# Patient Record
Sex: Male | Born: 1950 | Race: Black or African American | Hispanic: No | Marital: Married
Health system: Southern US, Community
[De-identification: ages and names within clinical notes are randomized; demographics above are authoritative.]

## PROBLEM LIST (undated history)

## (undated) DIAGNOSIS — J9621 Acute and chronic respiratory failure with hypoxia: Secondary | ICD-10-CM

## (undated) DIAGNOSIS — J1282 Pneumonia due to coronavirus disease 2019: Secondary | ICD-10-CM

## (undated) DIAGNOSIS — I5022 Chronic systolic (congestive) heart failure: Secondary | ICD-10-CM

## (undated) DIAGNOSIS — U071 COVID-19: Secondary | ICD-10-CM

## (undated) DIAGNOSIS — Z930 Tracheostomy status: Secondary | ICD-10-CM

---

## 2019-11-14 ENCOUNTER — Inpatient Hospital Stay
Admission: AD | Admit: 2019-11-14 | Discharge: 2019-12-18 | Disposition: A | Discharge status: Skilled Nursing Facility | Payer: Medicare Other | Source: Other Acute Inpatient Hospital | Attending: Internal Medicine | Admitting: Internal Medicine

## 2019-11-14 ENCOUNTER — Other Ambulatory Visit (HOSPITAL_COMMUNITY): Payer: Medicare Other

## 2019-11-14 DIAGNOSIS — Z930 Tracheostomy status: Secondary | ICD-10-CM

## 2019-11-14 DIAGNOSIS — Z978 Presence of other specified devices: Secondary | ICD-10-CM

## 2019-11-14 DIAGNOSIS — J189 Pneumonia, unspecified organism: Secondary | ICD-10-CM

## 2019-11-14 DIAGNOSIS — I509 Heart failure, unspecified: Secondary | ICD-10-CM

## 2019-11-14 DIAGNOSIS — R0902 Hypoxemia: Secondary | ICD-10-CM

## 2019-11-14 DIAGNOSIS — R109 Unspecified abdominal pain: Secondary | ICD-10-CM

## 2019-11-14 DIAGNOSIS — J9621 Acute and chronic respiratory failure with hypoxia: Secondary | ICD-10-CM | POA: Diagnosis present

## 2019-11-14 DIAGNOSIS — I5022 Chronic systolic (congestive) heart failure: Secondary | ICD-10-CM | POA: Diagnosis present

## 2019-11-14 DIAGNOSIS — J1282 Pneumonia due to coronavirus disease 2019: Secondary | ICD-10-CM | POA: Diagnosis present

## 2019-11-14 DIAGNOSIS — Z4659 Encounter for fitting and adjustment of other gastrointestinal appliance and device: Secondary | ICD-10-CM

## 2019-11-14 DIAGNOSIS — U071 COVID-19: Secondary | ICD-10-CM | POA: Diagnosis present

## 2019-11-14 DIAGNOSIS — J9690 Respiratory failure, unspecified, unspecified whether with hypoxia or hypercapnia: Secondary | ICD-10-CM

## 2019-11-14 HISTORY — DX: Chronic systolic (congestive) heart failure: I50.22

## 2019-11-14 HISTORY — DX: COVID-19: U07.1

## 2019-11-14 HISTORY — DX: Tracheostomy status: Z93.0

## 2019-11-14 HISTORY — DX: Acute and chronic respiratory failure with hypoxia: J96.21

## 2019-11-14 HISTORY — DX: Pneumonia due to coronavirus disease 2019: J12.82

## 2019-11-14 LAB — BLOOD GAS, ARTERIAL
Acid-Base Excess: 11.9 mmol/L — ABNORMAL HIGH (ref 0.0–2.0)
Bicarbonate: 38.2 mmol/L — ABNORMAL HIGH (ref 20.0–28.0)
Drawn by: 354196
FIO2: 60
O2 Saturation: 93.2 %
Patient temperature: 37
pCO2 arterial: 76.5 mmHg (ref 32.0–48.0)
pH, Arterial: 7.319 — ABNORMAL LOW (ref 7.350–7.450)
pO2, Arterial: 75.1 mmHg — ABNORMAL LOW (ref 83.0–108.0)

## 2019-11-15 LAB — PROTIME-INR
INR: 1.4 — ABNORMAL HIGH (ref 0.8–1.2)
Prothrombin Time: 16.6 seconds — ABNORMAL HIGH (ref 11.4–15.2)

## 2019-11-15 LAB — COMPREHENSIVE METABOLIC PANEL
ALT: 18 U/L (ref 0–44)
AST: 24 U/L (ref 15–41)
Albumin: 1.6 g/dL — ABNORMAL LOW (ref 3.5–5.0)
Alkaline Phosphatase: 59 U/L (ref 38–126)
Anion gap: 9 (ref 5–15)
BUN: 18 mg/dL (ref 8–23)
CO2: 35 mmol/L — ABNORMAL HIGH (ref 22–32)
Calcium: 9.4 mg/dL (ref 8.9–10.3)
Chloride: 99 mmol/L (ref 98–111)
Creatinine, Ser: 0.76 mg/dL (ref 0.61–1.24)
GFR calc non Af Amer: >60 mL/min (ref >60)
Glucose, Bld: 100 mg/dL — ABNORMAL HIGH (ref 70–99)
Potassium: 3.8 mmol/L (ref 3.5–5.1)
Sodium: 143 mmol/L (ref 135–145)
Total Bilirubin: 0.4 mg/dL (ref 0.3–1.2)
Total Protein: 6.5 g/dL (ref 6.5–8.1)

## 2019-11-15 LAB — BLOOD GAS, ARTERIAL
Acid-Base Excess: 12.8 mmol/L — ABNORMAL HIGH (ref 0.0–2.0)
Bicarbonate: 37.8 mmol/L — ABNORMAL HIGH (ref 20.0–28.0)
FIO2: 60
O2 Saturation: 95.1 %
Patient temperature: 36.7
pCO2 arterial: 57.8 mmHg — ABNORMAL HIGH (ref 32.0–48.0)
pH, Arterial: 7.43 (ref 7.350–7.450)
pO2, Arterial: 72 mmHg — ABNORMAL LOW (ref 83.0–108.0)

## 2019-11-15 LAB — URINALYSIS, ROUTINE W REFLEX MICROSCOPIC
Bilirubin Urine: NEGATIVE
Glucose, UA: NEGATIVE mg/dL
Hgb urine dipstick: NEGATIVE
Ketones, ur: NEGATIVE mg/dL
Leukocytes,Ua: NEGATIVE
Nitrite: NEGATIVE
Protein, ur: 30 mg/dL — AB
Specific Gravity, Urine: 1.021 (ref 1.005–1.030)
pH: 5 (ref 5.0–8.0)

## 2019-11-15 LAB — CBC
HCT: 31.8 % — ABNORMAL LOW (ref 39.0–52.0)
Hemoglobin: 9.3 g/dL — ABNORMAL LOW (ref 13.0–17.0)
MCH: 25 pg — ABNORMAL LOW (ref 26.0–34.0)
MCHC: 29.2 g/dL — ABNORMAL LOW (ref 30.0–36.0)
MCV: 85.5 fL (ref 80.0–100.0)
Platelets: 461 10*3/uL — ABNORMAL HIGH (ref 150–400)
RBC: 3.72 MIL/uL — ABNORMAL LOW (ref 4.22–5.81)
RDW: 17.4 % — ABNORMAL HIGH (ref 11.5–15.5)
WBC: 8.1 10*3/uL (ref 4.0–10.5)
nRBC: 0 % (ref 0.0–0.2)

## 2019-11-15 LAB — TSH: TSH: 2.859 u[IU]/mL (ref 0.350–4.500)

## 2019-11-16 ENCOUNTER — Encounter: Payer: Self-pay | Admitting: Internal Medicine

## 2019-11-16 DIAGNOSIS — J9621 Acute and chronic respiratory failure with hypoxia: Secondary | ICD-10-CM

## 2019-11-16 DIAGNOSIS — I5022 Chronic systolic (congestive) heart failure: Secondary | ICD-10-CM

## 2019-11-16 DIAGNOSIS — J1282 Pneumonia due to coronavirus disease 2019: Secondary | ICD-10-CM

## 2019-11-16 DIAGNOSIS — Z930 Tracheostomy status: Secondary | ICD-10-CM

## 2019-11-16 DIAGNOSIS — U071 COVID-19: Secondary | ICD-10-CM

## 2019-11-16 NOTE — Consult Note (Signed)
Pulmonary Critical Care Medicine [facility] [place]  PULMONARY SERVICE  Date of Service: 11/16/2019  PULMONARY CRITICAL CARE Jacob Vasquez  [id number]  DOB: 18-Sep-1950   DOA: 11/14/2019  Referring Physician: Carron Curie, MD  HPI: Jacob Vasquez is a 69 y.o. male seen for follow up of Acute on Chronic Respiratory Failure.  Patient presents for further management and weaning.  Patient initially presented to the other facility with complaints of increasing swelling of the legs.  Was found at that time to have cardiogenic pulmonary edema initially started on BiPAP therapy subsequently developed shock with drop in blood pressure transferred to the ICU started on vasopressors.  The patient was treated aggressively for cardiac failure did have some improvement subsequently was found to have Klebsiella pneumonia urinary tract infection.  The patient was hypoxemic secondary to COVID-19 infection.  Patient subsequently was not able to come off the ventilator had to have a tracheostomy.  Now is transferred to our facility for further management.  Review of Systems:  ROS performed and is unremarkable other than noted above.  Past medical history: CSF leak COVID-19 Heart failure   Past surgical history: Tracheostomy  Social history: Unknown tobacco alcohol or drug abuse  Family history: Noncontributory to the present illness  Medications: Reviewed on Rounds  Physical Exam:  Vitals: Temperature is 97.1 pulse 68 respiratory rate 20 blood pressure is 142/92 saturations 100%  Ventilator Settings pressure assist control FiO2 55% IP 15 PEEP 10 tidal volume 515  . General: Comfortable at this time . Eyes: Grossly normal lids, irises & conjunctiva . ENT: grossly tongue is normal . Neck: no obvious mass . Cardiovascular: S1-S2 normal no gallop or rub . Respiratory: Scattered rhonchi expansion is equal . Abdomen: Soft and nontender . Skin: no rash seen on limited  exam . Musculoskeletal: not rigid . Psychiatric:unable to assess . Neurologic: no seizure no involuntary movements         Labs on Admission:  Basic Metabolic Panel: Recent Labs  Lab 11/15/19 1022  NA 143  K 3.8  CL 99  CO2 35*  GLUCOSE 100*  BUN 18  CREATININE 0.76  CALCIUM 9.4    Recent Labs  Lab 11/14/19 2230 11/15/19 0810  PHART 7.319* 7.430  PCO2ART 76.5* 57.8*  PO2ART 75.1* 72.0*  HCO3 38.2* 37.8*  O2SAT 93.2 95.1    Liver Function Tests: Recent Labs  Lab 11/15/19 1022  AST 24  ALT 18  ALKPHOS 59  BILITOT 0.4  PROT 6.5  ALBUMIN 1.6*   No results for input(s): LIPASE, AMYLASE in the last 168 hours. No results for input(s): AMMONIA in the last 168 hours.  CBC: Recent Labs  Lab 11/15/19 1022  WBC 8.1  HGB 9.3*  HCT 31.8*  MCV 85.5  PLT 461*    Cardiac Enzymes: No results for input(s): CKTOTAL, CKMB, CKMBINDEX, TROPONINI in the last 168 hours.  BNP (last 3 results) No results for input(s): BNP in the last 8760 hours.  ProBNP (last 3 results) No results for input(s): PROBNP in the last 8760 hours.   Radiological Exams on Admission: DG Abd 1 View  Result Date: 11/14/2019 CLINICAL DATA:  69 year old male with respiratory failure. NG placement. EXAM: PORTABLE CHEST 1 VIEW COMPARISON:  None. FINDINGS: Endotracheal tube above the carina and left sided PICC with tip over upper SVC. An enteric tube extends below the diaphragm with tip in the distal stomach. There is diffuse bilateral interstitial and hazy densities primarily involving the mid to  lower lung field which may represent edema or atypical pneumonia. Clinical correlation is recommended. No lobar consolidation, pleural effusion, or pneumothorax. There is mild cardiomegaly. Air is noted within the colon. There is degenerative changes of the spine. IMPRESSION: 1. Enteric tube with tip in the distal stomach. 2. Diffuse bilateral interstitial and hazy densities may represent edema or atypical  pneumonia. Electronically Signed   By: Elgie Collard M.D.   On: 11/14/2019 17:28   DG CHEST PORT 1 VIEW  Result Date: 11/14/2019 CLINICAL DATA:  69 year old male with respiratory failure. NG placement. EXAM: PORTABLE CHEST 1 VIEW COMPARISON:  None. FINDINGS: Endotracheal tube above the carina and left sided PICC with tip over upper SVC. An enteric tube extends below the diaphragm with tip in the distal stomach. There is diffuse bilateral interstitial and hazy densities primarily involving the mid to lower lung field which may represent edema or atypical pneumonia. Clinical correlation is recommended. No lobar consolidation, pleural effusion, or pneumothorax. There is mild cardiomegaly. Air is noted within the colon. There is degenerative changes of the spine. IMPRESSION: 1. Enteric tube with tip in the distal stomach. 2. Diffuse bilateral interstitial and hazy densities may represent edema or atypical pneumonia. Electronically Signed   By: Elgie Collard M.D.   On: 11/14/2019 17:28    Assessment/Plan Active Problems:   Acute on chronic respiratory failure with hypoxia (HCC)   COVID-19 virus infection   Pneumonia due to COVID-19 virus   Chronic HFrEF (heart failure with reduced ejection fraction) (HCC)   Tracheostomy status (HCC)   1. Acute on chronic respiratory failure hypoxia at this time patient is on full support respiratory therapy will assess the RSB I mechanics and try to wean today. 2. COVID-19 virus infection in recovery phase there is diffuse interstitial changes noted on the chest film we will continue to follow. 3. COVID-19 pneumonia has been treated in recovery phase 4. Chronic heart failure reduced ejection fraction patient has not had a recent echocardiogram would consider getting an echocardiogram. 5. Tracheostomy status right now remains in place we will continue to monitor.  I have personally seen and evaluated the patient, evaluated laboratory and imaging results,  formulated the assessment and plan and placed orders. The Patient requires high complexity decision making with multiple systems involvement.  Case was discussed on Rounds with the Respiratory Therapy Director and the Respiratory staff Time Spent  Yevonne Pax, MD [facility] Pulmonary Critical Care Medicine Sleep Medicine

## 2019-11-17 DIAGNOSIS — I5022 Chronic systolic (congestive) heart failure: Secondary | ICD-10-CM | POA: Diagnosis not present

## 2019-11-17 DIAGNOSIS — Z930 Tracheostomy status: Secondary | ICD-10-CM | POA: Diagnosis not present

## 2019-11-17 DIAGNOSIS — U071 COVID-19: Secondary | ICD-10-CM | POA: Diagnosis not present

## 2019-11-17 DIAGNOSIS — J9621 Acute and chronic respiratory failure with hypoxia: Secondary | ICD-10-CM | POA: Diagnosis not present

## 2019-11-17 NOTE — Progress Notes (Signed)
Pulmonary Critical Care Medicine [facility] [place]   PULMONARY CRITICAL CARE SERVICE  PROGRESS NOTE  Date of Service: 11/17/2019  Jacob Vasquez  [id number]  DOB: Jul 08, 1950   DOA: 11/14/2019  Referring Physician: Carron Curie, MD  HPI: Jacob Vasquez is a 69 y.o. male seen for follow up of Acute on Chronic Respiratory Failure.  Patient currently is on pressure support mode has been on 45% FiO2 today's goal is up to 6 hours  Medications: Reviewed on Rounds  Physical Exam:  Vitals: Temperature 96.4 pulse 61 respiratory rate 25 blood pressures 180/95 saturations 100%  Ventilator Settings pressure support FiO2 45% pressure 12/7  . General: Comfortable at this time . Eyes: Grossly normal lids, irises & conjunctiva . ENT: grossly tongue is normal . Neck: no obvious mass . Cardiovascular: S1 S2 normal no gallop . Respiratory: Scattered rhonchi expansion is equal . Abdomen: soft . Skin: no rash seen on limited exam . Musculoskeletal: not rigid . Psychiatric:unable to assess . Neurologic: no seizure no involuntary movements         Lab Data:   Basic Metabolic Panel: Recent Labs  Lab 11/15/19 1022  NA 143  K 3.8  CL 99  CO2 35*  GLUCOSE 100*  BUN 18  CREATININE 0.76  CALCIUM 9.4    ABG: Recent Labs  Lab 11/14/19 2230 11/15/19 0810  PHART 7.319* 7.430  PCO2ART 76.5* 57.8*  PO2ART 75.1* 72.0*  HCO3 38.2* 37.8*  O2SAT 93.2 95.1    Liver Function Tests: Recent Labs  Lab 11/15/19 1022  AST 24  ALT 18  ALKPHOS 59  BILITOT 0.4  PROT 6.5  ALBUMIN 1.6*   No results for input(s): LIPASE, AMYLASE in the last 168 hours. No results for input(s): AMMONIA in the last 168 hours.  CBC: Recent Labs  Lab 11/15/19 1022  WBC 8.1  HGB 9.3*  HCT 31.8*  MCV 85.5  PLT 461*    Cardiac Enzymes: No results for input(s): CKTOTAL, CKMB, CKMBINDEX, TROPONINI in the last 168 hours.  BNP (last 3 results) No results for input(s): BNP in the last 8760  hours.  ProBNP (last 3 results) No results for input(s): PROBNP in the last 8760 hours.  Radiological Exams: No results found.  Assessment/Plan Active Problems:   Acute on chronic respiratory failure with hypoxia (HCC)   COVID-19 virus infection   Pneumonia due to COVID-19 virus   Chronic HFrEF (heart failure with reduced ejection fraction) (HCC)   Tracheostomy status (HCC)   1. Acute on chronic respiratory failure hypoxia we will continue to wean on pressure support 12/7 goal is up to 6 hours 2. COVID-19 virus infection in resolution we will continue to follow 3. Pneumonia due to COVID-19 has been treated we will continue with supportive care 4. Chronic heart failure reduced ejection fraction supportive care 5. Tracheostomy remains in place   I have personally seen and evaluated the patient, evaluated laboratory and imaging results, formulated the assessment and plan and placed orders. The Patient requires high complexity decision making with multiple systems involvement.  Rounds were done with the Respiratory Therapy Director and Staff therapists and discussed with nursing staff also.  Yevonne Pax, MD [facility] Pulmonary Critical Care Medicine Sleep Medicine

## 2019-11-18 DIAGNOSIS — Z930 Tracheostomy status: Secondary | ICD-10-CM | POA: Diagnosis not present

## 2019-11-18 DIAGNOSIS — U071 COVID-19: Secondary | ICD-10-CM | POA: Diagnosis not present

## 2019-11-18 DIAGNOSIS — I5022 Chronic systolic (congestive) heart failure: Secondary | ICD-10-CM | POA: Diagnosis not present

## 2019-11-18 DIAGNOSIS — J9621 Acute and chronic respiratory failure with hypoxia: Secondary | ICD-10-CM | POA: Diagnosis not present

## 2019-11-18 NOTE — Progress Notes (Signed)
Pulmonary Critical Care Medicine [facility] [place]   PULMONARY CRITICAL CARE SERVICE  PROGRESS NOTE  Date of Service: 11/18/2019  Jacob Vasquez  [id number]  DOB: 08-14-1950   DOA: 11/14/2019  Referring Physician: Carron Curie, MD  HPI: Jacob Vasquez is a 69 y.o. male seen for follow up of Acute on Chronic Respiratory Failure.  Patient currently is on pressure support has been on 45% FiO2 and is on 12/7 for a goal of 12 hours  Medications: Reviewed on Rounds  Physical Exam:  Vitals: Temperature is 96.2 pulse 65 respiratory rate 25 blood pressure is 173/73 saturations 98%  Ventilator Settings on pressure support FiO2 is 45% pressure 12/7  . General: Comfortable at this time . Eyes: Grossly normal lids, irises & conjunctiva . ENT: grossly tongue is normal . Neck: no obvious mass . Cardiovascular: S1 S2 normal no gallop . Respiratory: No rhonchi very coarse breath sounds . Abdomen: soft . Skin: no rash seen on limited exam . Musculoskeletal: not rigid . Psychiatric:unable to assess . Neurologic: no seizure no involuntary movements         Lab Data:   Basic Metabolic Panel: Recent Labs  Lab 11/15/19 1022  NA 143  K 3.8  CL 99  CO2 35*  GLUCOSE 100*  BUN 18  CREATININE 0.76  CALCIUM 9.4    ABG: Recent Labs  Lab 11/14/19 2230 11/15/19 0810  PHART 7.319* 7.430  PCO2ART 76.5* 57.8*  PO2ART 75.1* 72.0*  HCO3 38.2* 37.8*  O2SAT 93.2 95.1    Liver Function Tests: Recent Labs  Lab 11/15/19 1022  AST 24  ALT 18  ALKPHOS 59  BILITOT 0.4  PROT 6.5  ALBUMIN 1.6*   No results for input(s): LIPASE, AMYLASE in the last 168 hours. No results for input(s): AMMONIA in the last 168 hours.  CBC: Recent Labs  Lab 11/15/19 1022  WBC 8.1  HGB 9.3*  HCT 31.8*  MCV 85.5  PLT 461*    Cardiac Enzymes: No results for input(s): CKTOTAL, CKMB, CKMBINDEX, TROPONINI in the last 168 hours.  BNP (last 3 results) No results for input(s): BNP in the  last 8760 hours.  ProBNP (last 3 results) No results for input(s): PROBNP in the last 8760 hours.  Radiological Exams: No results found.  Assessment/Plan Active Problems:   Acute on chronic respiratory failure with hypoxia (HCC)   COVID-19 virus infection   Pneumonia due to COVID-19 virus   Chronic HFrEF (heart failure with reduced ejection fraction) (HCC)   Tracheostomy status (HCC)   1. Acute on chronic respiratory failure hypoxia we will continue to wean as tolerated on pressure support hopefully complete 12 hours today. 2. COVID-19 virus infection in recovery we will continue to monitor 3. Pneumonia due to COVID-19 treated 4. Chronic heart failure reduced ejection fraction monitor EF closely 5. Tracheostomy remains in place   I have personally seen and evaluated the patient, evaluated laboratory and imaging results, formulated the assessment and plan and placed orders. The Patient requires high complexity decision making with multiple systems involvement.  Rounds were done with the Respiratory Therapy Director and Staff therapists and discussed with nursing staff also.  Yevonne Pax, MD [facility] Pulmonary Critical Care Medicine Sleep Medicine

## 2019-11-19 DIAGNOSIS — I5022 Chronic systolic (congestive) heart failure: Secondary | ICD-10-CM | POA: Diagnosis not present

## 2019-11-19 DIAGNOSIS — U071 COVID-19: Secondary | ICD-10-CM | POA: Diagnosis not present

## 2019-11-19 DIAGNOSIS — J9621 Acute and chronic respiratory failure with hypoxia: Secondary | ICD-10-CM | POA: Diagnosis not present

## 2019-11-19 DIAGNOSIS — Z930 Tracheostomy status: Secondary | ICD-10-CM | POA: Diagnosis not present

## 2019-11-19 NOTE — Progress Notes (Addendum)
Pulmonary Critical Care Medicine [facility] [place]   PULMONARY CRITICAL CARE SERVICE  PROGRESS NOTE  Date of Service: 11/19/2019  Jacob Vasquez  [id number]  DOB: 1950/12/14   DOA: 11/14/2019  Referring Physician: Carron Curie, MD  HPI: Jacob Vasquez is a 69 y.o. male seen for follow up of Acute on Chronic Respiratory Failure.  Patient continues to wean on aerosol trach collar currently on 45% FiO2 has a 12-hour goal today satting well no distress.  Medications: Reviewed on Rounds  Physical Exam:  Vitals: Pulse 59 respirations 20 BP 179/93 O2 sat 97% temp 96.9  Ventilator Settings not currently on ventilator  . General: Comfortable at this time . Eyes: Grossly normal lids, irises & conjunctiva . ENT: grossly tongue is normal . Neck: no obvious mass . Cardiovascular: S1 S2 normal no gallop . Respiratory: Coarse breath sounds . Abdomen: soft . Skin: no rash seen on limited exam . Musculoskeletal: not rigid . Psychiatric:unable to assess . Neurologic: no seizure no involuntary movements         Lab Data:   Basic Metabolic Panel: Recent Labs  Lab 11/15/19 1022  NA 143  K 3.8  CL 99  CO2 35*  GLUCOSE 100*  BUN 18  CREATININE 0.76  CALCIUM 9.4    ABG: Recent Labs  Lab 11/14/19 2230 11/15/19 0810  PHART 7.319* 7.430  PCO2ART 76.5* 57.8*  PO2ART 75.1* 72.0*  HCO3 38.2* 37.8*  O2SAT 93.2 95.1    Liver Function Tests: Recent Labs  Lab 11/15/19 1022  AST 24  ALT 18  ALKPHOS 59  BILITOT 0.4  PROT 6.5  ALBUMIN 1.6*   No results for input(s): LIPASE, AMYLASE in the last 168 hours. No results for input(s): AMMONIA in the last 168 hours.  CBC: Recent Labs  Lab 11/15/19 1022  WBC 8.1  HGB 9.3*  HCT 31.8*  MCV 85.5  PLT 461*    Cardiac Enzymes: No results for input(s): CKTOTAL, CKMB, CKMBINDEX, TROPONINI in the last 168 hours.  BNP (last 3 results) No results for input(s): BNP in the last 8760 hours.  ProBNP (last 3  results) No results for input(s): PROBNP in the last 8760 hours.  Radiological Exams: No results found.  Assessment/Plan Active Problems:   Acute on chronic respiratory failure with hypoxia (HCC)   COVID-19 virus infection   Pneumonia due to COVID-19 virus   Chronic HFrEF (heart failure with reduced ejection fraction) (HCC)   Tracheostomy status (HCC)   1. Acute on chronic respiratory failure hypoxia patient will continue to wean on aerosol trach collar currently 45% FiO2 for 12-hour goal today we will continue supportive measures and pulmonary toilet. 2. COVID-19 virus infection in recovery we will continue to monitor 3. Pneumonia due to COVID-19 treated 4. Chronic heart failure reduced ejection fraction monitor EF closely 5. Tracheostomy remains in place   I have personally seen and evaluated the patient, evaluated laboratory and imaging results, formulated the assessment and plan and placed orders. The Patient requires high complexity decision making with multiple systems involvement.  Rounds were done with the Respiratory Therapy Director and Staff therapists and discussed with nursing staff also.  Yevonne Pax, MD [facility] Pulmonary Critical Care Medicine Sleep Medicine

## 2019-11-20 DIAGNOSIS — Z930 Tracheostomy status: Secondary | ICD-10-CM | POA: Diagnosis not present

## 2019-11-20 DIAGNOSIS — J9621 Acute and chronic respiratory failure with hypoxia: Secondary | ICD-10-CM | POA: Diagnosis not present

## 2019-11-20 DIAGNOSIS — I5022 Chronic systolic (congestive) heart failure: Secondary | ICD-10-CM | POA: Diagnosis not present

## 2019-11-20 DIAGNOSIS — U071 COVID-19: Secondary | ICD-10-CM | POA: Diagnosis not present

## 2019-11-20 LAB — CBC
HCT: 36.4 % — ABNORMAL LOW (ref 39.0–52.0)
Hemoglobin: 11.1 g/dL — ABNORMAL LOW (ref 13.0–17.0)
MCH: 25.8 pg — ABNORMAL LOW (ref 26.0–34.0)
MCHC: 30.5 g/dL (ref 30.0–36.0)
MCV: 84.5 fL (ref 80.0–100.0)
Platelets: 429 10*3/uL — ABNORMAL HIGH (ref 150–400)
RBC: 4.31 MIL/uL (ref 4.22–5.81)
RDW: 18.9 % — ABNORMAL HIGH (ref 11.5–15.5)
WBC: 16.4 10*3/uL — ABNORMAL HIGH (ref 4.0–10.5)
nRBC: 0 % (ref 0.0–0.2)

## 2019-11-20 LAB — BASIC METABOLIC PANEL
Anion gap: 10 (ref 5–15)
BUN: 52 mg/dL — ABNORMAL HIGH (ref 8–23)
CO2: 36 mmol/L — ABNORMAL HIGH (ref 22–32)
Calcium: 10.2 mg/dL (ref 8.9–10.3)
Chloride: 100 mmol/L (ref 98–111)
Creatinine, Ser: 0.89 mg/dL (ref 0.61–1.24)
GFR, Estimated: >60 mL/min (ref >60)
Glucose, Bld: 197 mg/dL — ABNORMAL HIGH (ref 70–99)
Potassium: 3.9 mmol/L (ref 3.5–5.1)
Sodium: 146 mmol/L — ABNORMAL HIGH (ref 135–145)

## 2019-11-20 NOTE — Progress Notes (Addendum)
Pulmonary Critical Care Medicine [facility] [place]   PULMONARY CRITICAL CARE SERVICE  PROGRESS NOTE  Date of Service: 11/20/2019  Jacob Vasquez  [id number]  DOB: 07-10-1950   DOA: 11/14/2019  Referring Physician: Carron Curie, MD  HPI: Jacob Vasquez is a 69 y.o. male seen for follow up of Acute on Chronic Respiratory Failure.  Patient continues to wean on T-bar for 16-hour goal at this time satting well no distress.  Medications: Reviewed on Rounds  Physical Exam:  Vitals: Pulse 64 respirations 22 BP 110/88 O2 sat 95% temp 98.2  Ventilator Settings not currently on ventilator  . General: Comfortable at this time . Eyes: Grossly normal lids, irises & conjunctiva . ENT: grossly tongue is normal . Neck: no obvious mass . Cardiovascular: S1 S2 normal no gallop . Respiratory: No rales or rhonchi noted . Abdomen: soft . Skin: no rash seen on limited exam . Musculoskeletal: not rigid . Psychiatric:unable to assess . Neurologic: no seizure no involuntary movements         Lab Data:   Basic Metabolic Panel: Recent Labs  Lab 11/15/19 1022 11/20/19 0546  NA 143 146*  K 3.8 3.9  CL 99 100  CO2 35* 36*  GLUCOSE 100* 197*  BUN 18 52*  CREATININE 0.76 0.89  CALCIUM 9.4 10.2    ABG: Recent Labs  Lab 11/14/19 2230 11/15/19 0810  PHART 7.319* 7.430  PCO2ART 76.5* 57.8*  PO2ART 75.1* 72.0*  HCO3 38.2* 37.8*  O2SAT 93.2 95.1    Liver Function Tests: Recent Labs  Lab 11/15/19 1022  AST 24  ALT 18  ALKPHOS 59  BILITOT 0.4  PROT 6.5  ALBUMIN 1.6*   No results for input(s): LIPASE, AMYLASE in the last 168 hours. No results for input(s): AMMONIA in the last 168 hours.  CBC: Recent Labs  Lab 11/15/19 1022 11/20/19 0546  WBC 8.1 16.4*  HGB 9.3* 11.1*  HCT 31.8* 36.4*  MCV 85.5 84.5  PLT 461* 429*    Cardiac Enzymes: No results for input(s): CKTOTAL, CKMB, CKMBINDEX, TROPONINI in the last 168 hours.  BNP (last 3 results) No results for  input(s): BNP in the last 8760 hours.  ProBNP (last 3 results) No results for input(s): PROBNP in the last 8760 hours.  Radiological Exams: No results found.  Assessment/Plan Active Problems:   Acute on chronic respiratory failure with hypoxia (HCC)   COVID-19 virus infection   Pneumonia due to COVID-19 virus   Chronic HFrEF (heart failure with reduced ejection fraction) (HCC)   Tracheostomy status (HCC)   1. Acute on chronic respiratory failure hypoxia  patient will continue to wean for 16-hour goal today on 35% T-bar will continue aggressive pulmonary toilet supportive measures. 2. COVID-19 virus infection in recovery we will continue to monitor 3. Pneumonia due to COVID-19 treated 4. Chronic heart failure reduced ejection fraction monitor EF closely 5. Tracheostomy remains in place   I have personally seen and evaluated the patient, evaluated laboratory and imaging results, formulated the assessment and plan and placed orders. The Patient requires high complexity decision making with multiple systems involvement.  Rounds were done with the Respiratory Therapy Director and Staff therapists and discussed with nursing staff also.  Yevonne Pax, MD [facility] Pulmonary Critical Care Medicine Sleep Medicine

## 2019-11-21 DIAGNOSIS — I5022 Chronic systolic (congestive) heart failure: Secondary | ICD-10-CM | POA: Diagnosis not present

## 2019-11-21 DIAGNOSIS — J9621 Acute and chronic respiratory failure with hypoxia: Secondary | ICD-10-CM | POA: Diagnosis not present

## 2019-11-21 DIAGNOSIS — U071 COVID-19: Secondary | ICD-10-CM | POA: Diagnosis not present

## 2019-11-21 DIAGNOSIS — Z930 Tracheostomy status: Secondary | ICD-10-CM | POA: Diagnosis not present

## 2019-11-21 LAB — BLOOD GAS, ARTERIAL
Acid-Base Excess: 12.3 mmol/L — ABNORMAL HIGH (ref 0.0–2.0)
Bicarbonate: 36.4 mmol/L — ABNORMAL HIGH (ref 20.0–28.0)
FIO2: 35
O2 Saturation: 88.9 %
Patient temperature: 36.3
pCO2 arterial: 45.5 mmHg (ref 32.0–48.0)
pH, Arterial: 7.511 — ABNORMAL HIGH (ref 7.350–7.450)
pO2, Arterial: 53.7 mmHg — ABNORMAL LOW (ref 83.0–108.0)

## 2019-11-21 NOTE — Progress Notes (Addendum)
Pulmonary Critical Care Medicine [facility] [place]   PULMONARY CRITICAL CARE SERVICE  PROGRESS NOTE  Date of Service: 11/21/2019  Jacob Vasquez  [id number]  DOB: 05-22-1950   DOA: 11/14/2019  Referring Physician: Carron Curie, MD  HPI: Jacob Vasquez is a 69 y.o. male seen for follow up of Acute on Chronic Respiratory Failure.  Patient remains on aerosol trach collar at this time.  No distress noted.  Medications: Reviewed on Rounds  Physical Exam:  Vitals: Pulse 67 respirations 26 BP 178/94 O2 sat 93% temp 96.7  Ventilator Settings not currently on ventilator  . General: Comfortable at this time . Eyes: Grossly normal lids, irises & conjunctiva . ENT: grossly tongue is normal . Neck: no obvious mass . Cardiovascular: S1 S2 normal no gallop . Respiratory: Coarse breath sounds . Abdomen: soft . Skin: no rash seen on limited exam . Musculoskeletal: not rigid . Psychiatric:unable to assess . Neurologic: no seizure no involuntary movements         Lab Data:   Basic Metabolic Panel: Recent Labs  Lab 11/15/19 1022 11/20/19 0546  NA 143 146*  K 3.8 3.9  CL 99 100  CO2 35* 36*  GLUCOSE 100* 197*  BUN 18 52*  CREATININE 0.76 0.89  CALCIUM 9.4 10.2    ABG: Recent Labs  Lab 11/14/19 2230 11/15/19 0810  PHART 7.319* 7.430  PCO2ART 76.5* 57.8*  PO2ART 75.1* 72.0*  HCO3 38.2* 37.8*  O2SAT 93.2 95.1    Liver Function Tests: Recent Labs  Lab 11/15/19 1022  AST 24  ALT 18  ALKPHOS 59  BILITOT 0.4  PROT 6.5  ALBUMIN 1.6*   No results for input(s): LIPASE, AMYLASE in the last 168 hours. No results for input(s): AMMONIA in the last 168 hours.  CBC: Recent Labs  Lab 11/15/19 1022 11/20/19 0546  WBC 8.1 16.4*  HGB 9.3* 11.1*  HCT 31.8* 36.4*  MCV 85.5 84.5  PLT 461* 429*    Cardiac Enzymes: No results for input(s): CKTOTAL, CKMB, CKMBINDEX, TROPONINI in the last 168 hours.  BNP (last 3 results) No results for input(s): BNP in the  last 8760 hours.  ProBNP (last 3 results) No results for input(s): PROBNP in the last 8760 hours.  Radiological Exams: No results found.  Assessment/Plan Active Problems:   Acute on chronic respiratory failure with hypoxia (HCC)   COVID-19 virus infection   Pneumonia due to COVID-19 virus   Chronic HFrEF (heart failure with reduced ejection fraction) (HCC)   Tracheostomy status (HCC)   1. Acute on chronic respiratory failure hypoxiapatient will continue to wean on aerosol trach collar 35% FiO2 has completed 24 hours at this time.  We will continue aggressive pulmonary toilet supportive measures. 2. COVID-19 virus infection in recovery we will continue to monitor 3. Pneumonia due to COVID-19 treated 4. Chronic heart failure reduced ejection fraction monitor EF closely 5. Tracheostomy remains in place   I have personally seen and evaluated the patient, evaluated laboratory and imaging results, formulated the assessment and plan and placed orders. The Patient requires high complexity decision making with multiple systems involvement.  Rounds were done with the Respiratory Therapy Director and Staff therapists and discussed with nursing staff also.  Yevonne Pax, MD [facility] Pulmonary Critical Care Medicine Sleep Medicine

## 2019-11-22 DIAGNOSIS — I5022 Chronic systolic (congestive) heart failure: Secondary | ICD-10-CM | POA: Diagnosis not present

## 2019-11-22 DIAGNOSIS — U071 COVID-19: Secondary | ICD-10-CM | POA: Diagnosis not present

## 2019-11-22 DIAGNOSIS — J9621 Acute and chronic respiratory failure with hypoxia: Secondary | ICD-10-CM | POA: Diagnosis not present

## 2019-11-22 DIAGNOSIS — Z930 Tracheostomy status: Secondary | ICD-10-CM | POA: Diagnosis not present

## 2019-11-22 LAB — BASIC METABOLIC PANEL
Anion gap: 10 (ref 5–15)
BUN: 46 mg/dL — ABNORMAL HIGH (ref 8–23)
CO2: 32 mmol/L (ref 22–32)
Calcium: 9.3 mg/dL (ref 8.9–10.3)
Chloride: 104 mmol/L (ref 98–111)
Creatinine, Ser: 0.82 mg/dL (ref 0.61–1.24)
GFR, Estimated: >60 mL/min (ref >60)
Glucose, Bld: 249 mg/dL — ABNORMAL HIGH (ref 70–99)
Potassium: 4.2 mmol/L (ref 3.5–5.1)
Sodium: 146 mmol/L — ABNORMAL HIGH (ref 135–145)

## 2019-11-22 LAB — CBC
HCT: 34.3 % — ABNORMAL LOW (ref 39.0–52.0)
Hemoglobin: 10.9 g/dL — ABNORMAL LOW (ref 13.0–17.0)
MCH: 26 pg (ref 26.0–34.0)
MCHC: 31.8 g/dL (ref 30.0–36.0)
MCV: 81.7 fL (ref 80.0–100.0)
Platelets: 272 10*3/uL (ref 150–400)
RBC: 4.2 MIL/uL — ABNORMAL LOW (ref 4.22–5.81)
RDW: 19.9 % — ABNORMAL HIGH (ref 11.5–15.5)
WBC: 12.6 10*3/uL — ABNORMAL HIGH (ref 4.0–10.5)
nRBC: 0.2 % (ref 0.0–0.2)

## 2019-11-22 NOTE — Progress Notes (Addendum)
Pulmonary Critical Care Medicine [facility] [place]   PULMONARY CRITICAL CARE SERVICE  PROGRESS NOTE  Date of Service: 11/22/2019  Jacob Vasquez  YBO:[id number]  DOB: 12/14/1950   DOA: 11/14/2019  Referring Physician: Carron Curie, MD  HPI: Jacob Vasquez is a 69 y.o. male seen for follow up of Acute on Chronic Respiratory Failure.  Patient remains on aerosol trach collar 40% at this time.  Weaning well with no distress.  Medications: Reviewed on Rounds  Physical Exam:  Vitals: Pulse 64 respirations 22 BP 152/100 O2 sat 98% temp 96.9  Ventilator Settings not currently on ventilator  . General: Comfortable at this time . Eyes: Grossly normal lids, irises & conjunctiva . ENT: grossly tongue is normal . Neck: no obvious mass . Cardiovascular: S1 S2 normal no gallop . Respiratory: No rales or rhonchi noted . Abdomen: soft . Skin: no rash seen on limited exam . Musculoskeletal: not rigid . Psychiatric:unable to assess . Neurologic: no seizure no involuntary movements         Lab Data:   Basic Metabolic Panel: Recent Labs  Lab 11/20/19 0546 11/22/19 0413  NA 146* 146*  K 3.9 4.2  CL 100 104  CO2 36* 32  GLUCOSE 197* 249*  BUN 52* 46*  CREATININE 0.89 0.82  CALCIUM 10.2 9.3    ABG: Recent Labs  Lab 11/21/19 1810  PHART 7.511*  PCO2ART 45.5  PO2ART 53.7*  HCO3 36.4*  O2SAT 88.9    Liver Function Tests: No results for input(s): AST, ALT, ALKPHOS, BILITOT, PROT, ALBUMIN in the last 168 hours. No results for input(s): LIPASE, AMYLASE in the last 168 hours. No results for input(s): AMMONIA in the last 168 hours.  CBC: Recent Labs  Lab 11/20/19 0546 11/22/19 0413  WBC 16.4* 12.6*  HGB 11.1* 10.9*  HCT 36.4* 34.3*  MCV 84.5 81.7  PLT 429* 272    Cardiac Enzymes: No results for input(s): CKTOTAL, CKMB, CKMBINDEX, TROPONINI in the last 168 hours.  BNP (last 3 results) No results for input(s): BNP in the last 8760 hours.  ProBNP (last 3  results) No results for input(s): PROBNP in the last 8760 hours.  Radiological Exams: No results found.  Assessment/Plan Active Problems:   Acute on chronic respiratory failure with hypoxia (HCC)   COVID-19 virus infection   Pneumonia due to COVID-19 virus   Chronic HFrEF (heart failure with reduced ejection fraction) (HCC)   Tracheostomy status (HCC)   1. Acute on chronic respiratory failure hypoxiapatient continuing on aerosol trach collar FiO2 was increased to 40% overnight we will continue as tolerated.  Continue aggressive pulmonary toilet supportive measures. 2. COVID-19 virus infection in recovery we will continue to monitor 3. Pneumonia due to COVID-19 treated 4. Chronic heart failure reduced ejection fraction monitor EF closely 5. Tracheostomy remains in place   I have personally seen and evaluated the patient, evaluated laboratory and imaging results, formulated the assessment and plan and placed orders. The Patient requires high complexity decision making with multiple systems involvement.  Rounds were done with the Respiratory Therapy Director and Staff therapists and discussed with nursing staff also.  Yevonne Pax, MD [facility] Pulmonary Critical Care Medicine Sleep Medicine

## 2019-11-23 DIAGNOSIS — Z930 Tracheostomy status: Secondary | ICD-10-CM | POA: Diagnosis not present

## 2019-11-23 DIAGNOSIS — I5022 Chronic systolic (congestive) heart failure: Secondary | ICD-10-CM | POA: Diagnosis not present

## 2019-11-23 DIAGNOSIS — J9621 Acute and chronic respiratory failure with hypoxia: Secondary | ICD-10-CM | POA: Diagnosis not present

## 2019-11-23 DIAGNOSIS — U071 COVID-19: Secondary | ICD-10-CM | POA: Diagnosis not present

## 2019-11-23 LAB — BLOOD GAS, ARTERIAL
Acid-Base Excess: 11.6 mmol/L — ABNORMAL HIGH (ref 0.0–2.0)
Bicarbonate: 35.7 mmol/L — ABNORMAL HIGH (ref 20.0–28.0)
FIO2: 40
O2 Saturation: 93.5 %
Patient temperature: 37
pCO2 arterial: 46.8 mmHg (ref 32.0–48.0)
pH, Arterial: 7.494 — ABNORMAL HIGH (ref 7.350–7.450)
pO2, Arterial: 67.6 mmHg — ABNORMAL LOW (ref 83.0–108.0)

## 2019-11-23 NOTE — Consult Note (Signed)
Referring Physician: Dr. Quentin Angst is an 69 y.o. male.                       Chief Complaint: Hypertension/COVID 19 infection  HPI: 69 years old black male with h/o COVID 19 pneumonia has acute on chronic respiratory failure with hypoxia. He is now on 10 liters of O 2 by nasal cannula. He has h/o hypertension. He is s/p tracheostomy. He denies headache, chest pain or dizziness.  Past Medical History:  Diagnosis Date  . Acute on chronic respiratory failure with hypoxia (HCC)   . Chronic HFrEF (heart failure with reduced ejection fraction) (HCC)   . COVID-19 virus infection   . Pneumonia due to COVID-19 virus   . Tracheostomy status (HCC)       The histories are not reviewed yet. Please review them in the "History" navigator section and refresh this SmartLink.  Family history: Non-contributory  Social History:  has no history on file for tobacco use, alcohol use, and drug use.  Allergies: Not on File  No medications prior to admission.    Results for orders placed or performed during the hospital encounter of 11/14/19 (from the past 48 hour(s))  Blood gas, arterial     Status: Abnormal   Collection Time: 11/21/19  6:10 PM  Result Value Ref Range   FIO2 35.00    pH, Arterial 7.511 (H) 7.35 - 7.45   pCO2 arterial 45.5 32 - 48 mmHg   pO2, Arterial 53.7 (L) 83 - 108 mmHg   Bicarbonate 36.4 (H) 20.0 - 28.0 mmol/L   Acid-Base Excess 12.3 (H) 0.0 - 2.0 mmol/L   O2 Saturation 88.9 %   Patient temperature 36.3    Collection site RIGHT RADIAL    Drawn by  Talitha Givens    Sample type ARTERIAL DRAW    Allens test (pass/fail) PASS PASS    Comment: Performed at [facility], [address], [place], [place] [address]  CBC     Status: Abnormal   Collection Time: 11/22/19  4:13 AM  Result Value Ref Range   WBC 12.6 (H) 4.0 - 10.5 K/uL   RBC 4.20 (L) 4.22 - 5.81 MIL/uL   Hemoglobin 10.9 (L) 13.0 - 17.0 g/dL   HCT 85.8 (L) 39 - 52 %   MCV 81.7 80.0 - 100.0 fL   MCH  26.0 26.0 - 34.0 pg   MCHC 31.8 30.0 - 36.0 g/dL   RDW 85.0 (H) 27.7 - 41.2 %   Platelets 272 150 - 400 K/uL   nRBC 0.2 0.0 - 0.2 %    Comment: Performed at [facility], [address], [place], [place] [address]  Basic metabolic panel     Status: Abnormal   Collection Time: 11/22/19  4:13 AM  Result Value Ref Range   Sodium 146 (H) 135 - 145 mmol/L   Potassium 4.2 3.5 - 5.1 mmol/L   Chloride 104 98 - 111 mmol/L   CO2 32 22 - 32 mmol/L   Glucose, Bld 249 (H) 70 - 99 mg/dL    Comment: Glucose reference range applies only to samples taken after fasting for at least 8 hours.   BUN 46 (H) 8 - 23 mg/dL   Creatinine, Ser 6.72 0.61 - 1.24 mg/dL   Calcium 9.3 8.9 - 09.4 mg/dL   GFR, Estimated >70 >96 mL/min   Anion gap 10 5 - 15    Comment: Performed  at [facility], [address] [place], [place] [address]  Blood gas, arterial     Status: Abnormal   Collection Time: 11/23/19 10:48 AM  Result Value Ref Range   FIO2 40.00    pH, Arterial 7.494 (H) 7.35 - 7.45   pCO2 arterial 46.8 32 - 48 mmHg   pO2, Arterial 67.6 (L) 83 - 108 mmHg   Bicarbonate 35.7 (H) 20.0 - 28.0 mmol/L   Acid-Base Excess 11.6 (H) 0.0 - 2.0 mmol/L   O2 Saturation 93.5 %   Patient temperature 37.0    Collection site RIGHT RADIAL    Drawn by Cherrie Gauze    Sample type ARTERIAL DRAW    Allens test (pass/fail) PASS PASS    Comment: Performed at [facility], [address] [place], [place] [address]   No results found.  Review Of Systems Constitutional: No fever, chills, chronic weight loss. Eyes: No vision change, wears glasses. No discharge or pain. Ears: No hearing loss, No tinnitus. Respiratory: No asthma, COPD, positive pneumonias, shortness of breath. No hemoptysis. Cardiovascular: No chest pain, palpitation, leg edema. Gastrointestinal: No nausea, vomiting, diarrhea, constipation. No GI bleed. No hepatitis. Genitourinary: No dysuria, hematuria, kidney stone. No  incontinance. Neurological: No headache, stroke, seizures.  Psychiatry: No psych facility admission for anxiety, depression, suicide. No detox. Skin: No rash. Musculoskeletal: Positive joint pain,no  fibromyalgia. No neck pain, back pain. Lymphadenopathy: No lymphadenopathy. Hematology: Positive anemia or easy bruising.  P: 70, R: 20, BP: 162/94, O2 sat 96 % on 10 L/min by [place] There were no vitals taken for this visit. There is no height or weight on file to calculate BMI. General appearance: alert, cooperative, appears stated age and no distress Head: Normocephalic, atraumatic. Eyes: Brown eyes, Pale pink conjunctiva, corneas clear.  Neck: No adenopathy, no carotid bruit, no JVD, supple, symmetrical, Positive tracheostomy and thyroid not enlarged. Resp: Clearing to auscultation bilaterally. Cardio: Regular rate and rhythm, S1, S2 normal, II/VI systolic murmur, no click, rub or gallop GI: Soft, non-tender; bowel sounds normal; no organomegaly. Extremities: Trace left leg edema, no cyanosis or clubbing. Skin: Warm and dry.  Neurologic: Alert and oriented X 3, normal strength. Normal coordination.  Assessment/Plan Acute on chronic respiratory failure with hypoxia Hypertension COVID 19 infection with pneumonia Chronic left heart systolic failure S/P Tracheostomy  Add Isosorbide 30 mg. One daily.. Increase Entresto to 49-51 mg. Bid Continue Carvedilol 6.25 mg. Bid.  Time spent: Review of old records, Lab, x-rays, EKG, other cardiac tests, examination, discussion with patient, tech, nurse and PA over 70 minutes.  Ricki Rodriguez, MD  11/23/2019, 12:02 PM

## 2019-11-23 NOTE — Progress Notes (Signed)
Pulmonary Critical Care Medicine [facility] [place]   PULMONARY CRITICAL CARE SERVICE  PROGRESS NOTE  Date of Service: 11/23/2019  Jacob Vasquez  [id number]:[id number]  DOB: 1950/03/16   DOA: 11/14/2019  Referring Physician: Carron Curie, MD  HPI: Jacob Vasquez is a 69 y.o. male seen for follow up of Acute on Chronic Respiratory Failure.  Patient currently is on T-piece has been on 40% FiO2 with good saturations.  Medications: Reviewed on Rounds  Physical Exam:  Vitals: Temperature is 96.9 pulse 65 respiratory rate 17 blood pressure is 149/87 saturations 100%  Ventilator Settings on T-piece FiO2 40%  . General: Comfortable at this time . Eyes: Grossly normal lids, irises & conjunctiva . ENT: grossly tongue is normal . Neck: no obvious mass . Cardiovascular: S1 S2 normal no gallop . Respiratory: Scattered rhonchi expansion is equal . Abdomen: soft . Skin: no rash seen on limited exam . Musculoskeletal: not rigid . Psychiatric:unable to assess . Neurologic: no seizure no involuntary movements         Lab Data:   Basic Metabolic Panel: Recent Labs  Lab 11/20/19 0546 11/22/19 0413  NA 146* 146*  K 3.9 4.2  CL 100 104  CO2 36* 32  GLUCOSE 197* 249*  BUN 52* 46*  CREATININE 0.89 0.82  CALCIUM 10.2 9.3    ABG: Recent Labs  Lab 11/21/19 1810  PHART 7.511*  PCO2ART 45.5  PO2ART 53.7*  HCO3 36.4*  O2SAT 88.9    Liver Function Tests: No results for input(s): AST, ALT, ALKPHOS, BILITOT, PROT, ALBUMIN in the last 168 hours. No results for input(s): LIPASE, AMYLASE in the last 168 hours. No results for input(s): AMMONIA in the last 168 hours.  CBC: Recent Labs  Lab 11/20/19 0546 11/22/19 0413  WBC 16.4* 12.6*  HGB 11.1* 10.9*  HCT 36.4* 34.3*  MCV 84.5 81.7  PLT 429* 272    Cardiac Enzymes: No results for input(s): CKTOTAL, CKMB, CKMBINDEX, TROPONINI in the last 168 hours.  BNP (last 3 results) No results for input(s): BNP in the last 8760  hours.  ProBNP (last 3 results) No results for input(s): PROBNP in the last 8760 hours.  Radiological Exams: No results found.  Assessment/Plan Active Problems:   Acute on chronic respiratory failure with hypoxia (HCC)   COVID-19 virus infection   Pneumonia due to COVID-19 virus   Chronic HFrEF (heart failure with reduced ejection fraction) (HCC)   Tracheostomy status (HCC)   1. Acute on chronic respiratory failure with hypoxia we will continue with the T-piece wean titrate oxygen down as tolerated 2. COVID-19 virus infection in recovery phase continue supportive care. 3. Pneumonia due to COVID-19 treated supportive care 4. Chronic heart failure reduced ejection fraction prognosis guarded  5. tracheostomy remains in place   I have personally seen and evaluated the patient, evaluated laboratory and imaging results, formulated the assessment and plan and placed orders. The Patient requires high complexity decision making with multiple systems involvement.  Rounds were done with the Respiratory Therapy Director and Staff therapists and discussed with nursing staff also.  Yevonne Pax, MD [facility] Pulmonary Critical Care Medicine Sleep Medicine

## 2019-11-24 DIAGNOSIS — U071 COVID-19: Secondary | ICD-10-CM | POA: Diagnosis not present

## 2019-11-24 DIAGNOSIS — J9621 Acute and chronic respiratory failure with hypoxia: Secondary | ICD-10-CM | POA: Diagnosis not present

## 2019-11-24 DIAGNOSIS — Z930 Tracheostomy status: Secondary | ICD-10-CM | POA: Diagnosis not present

## 2019-11-24 DIAGNOSIS — I5022 Chronic systolic (congestive) heart failure: Secondary | ICD-10-CM | POA: Diagnosis not present

## 2019-11-24 LAB — BLOOD GAS, ARTERIAL
Acid-Base Excess: 10.8 mmol/L — ABNORMAL HIGH (ref 0.0–2.0)
Bicarbonate: 35.7 mmol/L — ABNORMAL HIGH (ref 20.0–28.0)
FIO2: 35
O2 Saturation: 86.6 %
pCO2 arterial: 49.8 mmHg — ABNORMAL HIGH (ref 32.0–48.0)
pH, Arterial: 7.47 — ABNORMAL HIGH (ref 7.350–7.450)
pO2, Arterial: 52 mmHg — ABNORMAL LOW (ref 83.0–108.0)

## 2019-11-24 NOTE — Consult Note (Signed)
Ref: Carron Curie, MD   Subjective:  Feeling better. BP improving.  Objective:  Vital Signs in the last 24 hours: BP: 151/82  P: 67 R: 18 O2 sat: 93 %.   Physical Exam: BP Readings from Last 1 Encounters:  No data found for BP     Wt Readings from Last 1 Encounters:  No data found for Wt    Weight change:  There is no height or weight on file to calculate BMI. HEENT: [place]/AT, Eyes-Brown, Conjunctiva-Pale pink, Sclera-Non-icteric Neck: No JVD, No bruit, Tracheostomy tube present.  Lungs:  Clearing, Bilateral. Cardiac:  Regular rhythm, normal S1 and S2, no S3. II/VI systolic murmur. Abdomen:  Soft, non-tender. BS present. Extremities:  Trace edema present. No cyanosis. No clubbing. CNS: AxOx3, Cranial nerves grossly intact, moves all 4 extremities.  Skin: Warm and dry.   Intake/Output from previous day: No intake/output data recorded.    Lab Results: BMET    Component Value Date/Time   NA 146 (H) 11/22/2019 0413   NA 146 (H) 11/20/2019 0546   NA 143 11/15/2019 1022   K 4.2 11/22/2019 0413   K 3.9 11/20/2019 0546   K 3.8 11/15/2019 1022   CL 104 11/22/2019 0413   CL 100 11/20/2019 0546   CL 99 11/15/2019 1022   CO2 32 11/22/2019 0413   CO2 36 (H) 11/20/2019 0546   CO2 35 (H) 11/15/2019 1022   GLUCOSE 249 (H) 11/22/2019 0413   GLUCOSE 197 (H) 11/20/2019 0546   GLUCOSE 100 (H) 11/15/2019 1022   BUN 46 (H) 11/22/2019 0413   BUN 52 (H) 11/20/2019 0546   BUN 18 11/15/2019 1022   CREATININE 0.82 11/22/2019 0413   CREATININE 0.89 11/20/2019 0546   CREATININE 0.76 11/15/2019 1022   CALCIUM 9.3 11/22/2019 0413   CALCIUM 10.2 11/20/2019 0546   CALCIUM 9.4 11/15/2019 1022   GFRNONAA >60 11/22/2019 0413   GFRNONAA >60 11/20/2019 0546   GFRNONAA >60 11/15/2019 1022   CBC    Component Value Date/Time   WBC 12.6 (H) 11/22/2019 0413   RBC 4.20 (L) 11/22/2019 0413   HGB 10.9 (L) 11/22/2019 0413   HCT 34.3 (L) 11/22/2019 0413   PLT 272 11/22/2019 0413   MCV 81.7  11/22/2019 0413   MCH 26.0 11/22/2019 0413   MCHC 31.8 11/22/2019 0413   RDW 19.9 (H) 11/22/2019 0413   HEPATIC Function Panel Recent Labs    11/15/19 1022  PROT 6.5   HEMOGLOBIN A1C No components found for: HGA1C,  MPG CARDIAC ENZYMES No results found for: CKTOTAL, CKMB, CKMBINDEX, TROPONINI BNP No results for input(s): PROBNP in the last 8760 hours. TSH Recent Labs    11/15/19 1020  TSH 2.859   CHOLESTEROL No results for input(s): CHOL in the last 8760 hours.  Scheduled Meds: Continuous Infusions: PRN Meds:.  Assessment/Plan: Acute on chronic respiratory failure with hypoxemia Hypertension COVID 19 infection with pneumonia Chronic systolic left heart failure S/P tracheostomy  Continue isosorbide, Entresto and carvedilol.   LOS: 0 days   Time spent including chart review, lab review, examination, discussion with patient and nurse : 30 min   Orpah Cobb  MD  11/24/2019, 11:12 AM

## 2019-11-24 NOTE — Progress Notes (Addendum)
Pulmonary Critical Care Medicine [facility] [place]   PULMONARY CRITICAL CARE SERVICE  PROGRESS NOTE  Date of Service: 11/24/2019  Jacob Vasquez  FYB:[id number]  DOB: 1950-12-28   DOA: 11/14/2019  Referring Physician: Carron Curie, MD  HPI: Jacob Vasquez is a 69 y.o. male seen for follow up of Acute on Chronic Respiratory Failure.  Patient remains on pressure support 12/5 FiO2 50% at this time ABG today revealed a PO2 of 50 on T-bar currently resting on vent.  Medications: Reviewed on Rounds  Physical Exam:  Vitals: Pulse 74 respiration 17 BP 142/73 O2 sat 96% temp 96.7  Ventilator Settings per support 12/5 FiO2 50%  . General: Comfortable at this time . Eyes: Grossly normal lids, irises & conjunctiva . ENT: grossly tongue is normal . Neck: no obvious mass . Cardiovascular: S1 S2 normal no gallop . Respiratory: No rales or rhonchi noted . Abdomen: soft . Skin: no rash seen on limited exam . Musculoskeletal: not rigid . Psychiatric:unable to assess . Neurologic: no seizure no involuntary movements         Lab Data:   Basic Metabolic Panel: Recent Labs  Lab 11/20/19 0546 11/22/19 0413  NA 146* 146*  K 3.9 4.2  CL 100 104  CO2 36* 32  GLUCOSE 197* 249*  BUN 52* 46*  CREATININE 0.89 0.82  CALCIUM 10.2 9.3    ABG: Recent Labs  Lab 11/21/19 1810 11/23/19 1048 11/24/19 1038  PHART 7.511* 7.494* 7.470*  PCO2ART 45.5 46.8 49.8*  PO2ART 53.7* 67.6* 52.0*  HCO3 36.4* 35.7* 35.7*  O2SAT 88.9 93.5 86.6    Liver Function Tests: No results for input(s): AST, ALT, ALKPHOS, BILITOT, PROT, ALBUMIN in the last 168 hours. No results for input(s): LIPASE, AMYLASE in the last 168 hours. No results for input(s): AMMONIA in the last 168 hours.  CBC: Recent Labs  Lab 11/20/19 0546 11/22/19 0413  WBC 16.4* 12.6*  HGB 11.1* 10.9*  HCT 36.4* 34.3*  MCV 84.5 81.7  PLT 429* 272    Cardiac Enzymes: No results for input(s): CKTOTAL, CKMB, CKMBINDEX,  TROPONINI in the last 168 hours.  BNP (last 3 results) No results for input(s): BNP in the last 8760 hours.  ProBNP (last 3 results) No results for input(s): PROBNP in the last 8760 hours.  Radiological Exams: No results found.  Assessment/Plan Active Problems:   Acute on chronic respiratory failure with hypoxia (HCC)   COVID-19 virus infection   Pneumonia due to COVID-19 virus   Chronic HFrEF (heart failure with reduced ejection fraction) (HCC)   Tracheostomy status (HCC)   1. Acute on chronic respiratory failure with hypoxia we will continue with the T-piece wean titrate oxygen down as tolerated 2. COVID-19 virus infection in recovery phase continue supportive care. 3. Pneumonia due to COVID-19 treated supportive care 4. Chronic heart failure reduced ejection fraction prognosis guarded  5. tracheostomy remains in place   I have personally seen and evaluated the patient, evaluated laboratory and imaging results, formulated the assessment and plan and placed orders. The Patient requires high complexity decision making with multiple systems involvement.  Rounds were done with the Respiratory Therapy Director and Staff therapists and discussed with nursing staff also.  Yevonne Pax, MD [facility] Pulmonary Critical Care Medicine Sleep Medicine

## 2019-11-25 ENCOUNTER — Other Ambulatory Visit (HOSPITAL_COMMUNITY): Payer: Medicare Other

## 2019-11-25 DIAGNOSIS — Z930 Tracheostomy status: Secondary | ICD-10-CM | POA: Diagnosis not present

## 2019-11-25 DIAGNOSIS — U071 COVID-19: Secondary | ICD-10-CM | POA: Diagnosis not present

## 2019-11-25 DIAGNOSIS — I5022 Chronic systolic (congestive) heart failure: Secondary | ICD-10-CM | POA: Diagnosis not present

## 2019-11-25 DIAGNOSIS — J9621 Acute and chronic respiratory failure with hypoxia: Secondary | ICD-10-CM | POA: Diagnosis not present

## 2019-11-25 LAB — BASIC METABOLIC PANEL
Anion gap: 6 (ref 5–15)
BUN: 57 mg/dL — ABNORMAL HIGH (ref 8–23)
CO2: 33 mmol/L — ABNORMAL HIGH (ref 22–32)
Calcium: 9.1 mg/dL (ref 8.9–10.3)
Chloride: 107 mmol/L (ref 98–111)
Creatinine, Ser: 0.73 mg/dL (ref 0.61–1.24)
GFR, Estimated: >60 mL/min (ref >60)
Glucose, Bld: 192 mg/dL — ABNORMAL HIGH (ref 70–99)
Potassium: 4.1 mmol/L (ref 3.5–5.1)
Sodium: 146 mmol/L — ABNORMAL HIGH (ref 135–145)

## 2019-11-25 LAB — CBC
HCT: 33 % — ABNORMAL LOW (ref 39.0–52.0)
Hemoglobin: 10.1 g/dL — ABNORMAL LOW (ref 13.0–17.0)
MCH: 25.9 pg — ABNORMAL LOW (ref 26.0–34.0)
MCHC: 30.6 g/dL (ref 30.0–36.0)
MCV: 84.6 fL (ref 80.0–100.0)
Platelets: 163 10*3/uL (ref 150–400)
RBC: 3.9 MIL/uL — ABNORMAL LOW (ref 4.22–5.81)
RDW: 20.8 % — ABNORMAL HIGH (ref 11.5–15.5)
WBC: 11.9 10*3/uL — ABNORMAL HIGH (ref 4.0–10.5)
nRBC: 0.2 % (ref 0.0–0.2)

## 2019-11-25 LAB — MAGNESIUM: Magnesium: 2.1 mg/dL (ref 1.7–2.4)

## 2019-11-25 NOTE — Progress Notes (Addendum)
Pulmonary Critical Care Medicine [facility] [place]   PULMONARY CRITICAL CARE SERVICE  PROGRESS NOTE  Date of Service: 11/25/2019  Jacob Vasquez  [id number]  DOB: October 28, 1950   DOA: 11/14/2019  Referring Physician: Carron Curie, MD  HPI: Jacob Vasquez is a 69 y.o. male seen for follow up of Acute on Chronic Respiratory Failure.  Patient currently is on T collar has been on 50% FiO2 today's goal is 12 hours  Medications: Reviewed on Rounds  Physical Exam:  Vitals: Temperature 97.2 pulse 71 respiratory 24 blood pressure is 140/81 saturations 100%  Ventilator Settings on T collar with FiO2 50%  . General: Comfortable at this time . Eyes: Grossly normal lids, irises & conjunctiva . ENT: grossly tongue is normal . Neck: no obvious mass . Cardiovascular: S1 S2 normal no gallop . Respiratory: No rhonchi very coarse breath sounds . Abdomen: soft . Skin: no rash seen on limited exam . Musculoskeletal: not rigid . Psychiatric:unable to assess . Neurologic: no seizure no involuntary movements         Lab Data:   Basic Metabolic Panel: Recent Labs  Lab 11/20/19 0546 11/22/19 0413 11/25/19 0639  NA 146* 146* 146*  K 3.9 4.2 4.1  CL 100 104 107  CO2 36* 32 33*  GLUCOSE 197* 249* 192*  BUN 52* 46* 57*  CREATININE 0.89 0.82 0.73  CALCIUM 10.2 9.3 9.1  MG  --   --  2.1    ABG: Recent Labs  Lab 11/21/19 1810 11/23/19 1048 11/24/19 1038  PHART 7.511* 7.494* 7.470*  PCO2ART 45.5 46.8 49.8*  PO2ART 53.7* 67.6* 52.0*  HCO3 36.4* 35.7* 35.7*  O2SAT 88.9 93.5 86.6    Liver Function Tests: No results for input(s): AST, ALT, ALKPHOS, BILITOT, PROT, ALBUMIN in the last 168 hours. No results for input(s): LIPASE, AMYLASE in the last 168 hours. No results for input(s): AMMONIA in the last 168 hours.  CBC: Recent Labs  Lab 11/20/19 0546 11/22/19 0413 11/25/19 0639  WBC 16.4* 12.6* 11.9*  HGB 11.1* 10.9* 10.1*  HCT 36.4* 34.3* 33.0*  MCV 84.5 81.7 84.6   PLT 429* 272 163    Cardiac Enzymes: No results for input(s): CKTOTAL, CKMB, CKMBINDEX, TROPONINI in the last 168 hours.  BNP (last 3 results) No results for input(s): BNP in the last 8760 hours.  ProBNP (last 3 results) No results for input(s): PROBNP in the last 8760 hours.  Radiological Exams: No results found.  Assessment/Plan Active Problems:   Acute on chronic respiratory failure with hypoxia (HCC)   COVID-19 virus infection   Pneumonia due to COVID-19 virus   Chronic HFrEF (heart failure with reduced ejection fraction) (HCC)   Tracheostomy status (HCC)   1. Acute on chronic respiratory failure hypoxia we'll continue with the T collar wean FiO2 as tolerated. 2. COVID-19 virus infection in recovery phase we'll continue to follow 3. Pneumonia due to COVID-19 has been treated we'll continue with present management 4. Chronic heart failure reduced ejection fraction being followed by cardiology we'll continue with their recommendations 5. Tracheostomy weaning on protocol 12-hour goal   I have personally seen and evaluated the patient, evaluated laboratory and imaging results, formulated the assessment and plan and placed orders. The Patient requires high complexity decision making with multiple systems involvement.  Rounds were done with the Respiratory Therapy Director and Staff therapists and discussed with nursing staff also.  Yevonne Pax, MD [facility] Pulmonary Critical Care Medicine Sleep Medicine

## 2019-11-26 DIAGNOSIS — Z930 Tracheostomy status: Secondary | ICD-10-CM | POA: Diagnosis not present

## 2019-11-26 DIAGNOSIS — J9621 Acute and chronic respiratory failure with hypoxia: Secondary | ICD-10-CM | POA: Diagnosis not present

## 2019-11-26 DIAGNOSIS — I5022 Chronic systolic (congestive) heart failure: Secondary | ICD-10-CM | POA: Diagnosis not present

## 2019-11-26 DIAGNOSIS — U071 COVID-19: Secondary | ICD-10-CM | POA: Diagnosis not present

## 2019-11-26 LAB — BLOOD GAS, ARTERIAL
Acid-Base Excess: 10.7 mmol/L — ABNORMAL HIGH (ref 0.0–2.0)
Bicarbonate: 34.8 mmol/L — ABNORMAL HIGH (ref 20.0–28.0)
FIO2: 28
O2 Saturation: 89.8 %
Patient temperature: 36.9
pCO2 arterial: 46.3 mmHg (ref 32.0–48.0)
pH, Arterial: 7.488 — ABNORMAL HIGH (ref 7.350–7.450)
pO2, Arterial: 55.8 mmHg — ABNORMAL LOW (ref 83.0–108.0)

## 2019-11-26 NOTE — Progress Notes (Addendum)
Pulmonary Critical Care Medicine [facility] [place]   PULMONARY CRITICAL CARE SERVICE  PROGRESS NOTE  Date of Service: 11/26/2019  Jacob Vasquez  DXA:[id number]  DOB: 02-17-1950   DOA: 11/14/2019  Referring Physician: Carron Curie, MD  HPI: Jacob Vasquez is a 69 y.o. male seen for follow up of Acute on Chronic Respiratory Failure.  Patient remains on aerosol trach collar 28% for 24-hour goal at this time satting well no distress.  Medications: Reviewed on Rounds  Physical Exam:  Vitals: Pulse 72 respirations 23 BP 145/69 O2 sat 99% temp 97.0  Ventilator Settings not currently on ventilator  . General: Comfortable at this time . Eyes: Grossly normal lids, irises & conjunctiva . ENT: grossly tongue is normal . Neck: no obvious mass . Cardiovascular: S1 S2 normal no gallop . Respiratory: No rales or rhonchi noted . Abdomen: soft . Skin: no rash seen on limited exam . Musculoskeletal: not rigid . Psychiatric:unable to assess . Neurologic: no seizure no involuntary movements         Lab Data:   Basic Metabolic Panel: Recent Labs  Lab 11/20/19 0546 11/22/19 0413 11/25/19 0639  NA 146* 146* 146*  K 3.9 4.2 4.1  CL 100 104 107  CO2 36* 32 33*  GLUCOSE 197* 249* 192*  BUN 52* 46* 57*  CREATININE 0.89 0.82 0.73  CALCIUM 10.2 9.3 9.1  MG  --   --  2.1    ABG: Recent Labs  Lab 11/21/19 1810 11/23/19 1048 11/24/19 1038  PHART 7.511* 7.494* 7.470*  PCO2ART 45.5 46.8 49.8*  PO2ART 53.7* 67.6* 52.0*  HCO3 36.4* 35.7* 35.7*  O2SAT 88.9 93.5 86.6    Liver Function Tests: No results for input(s): AST, ALT, ALKPHOS, BILITOT, PROT, ALBUMIN in the last 168 hours. No results for input(s): LIPASE, AMYLASE in the last 168 hours. No results for input(s): AMMONIA in the last 168 hours.  CBC: Recent Labs  Lab 11/20/19 0546 11/22/19 0413 11/25/19 0639  WBC 16.4* 12.6* 11.9*  HGB 11.1* 10.9* 10.1*  HCT 36.4* 34.3* 33.0*  MCV 84.5 81.7 84.6  PLT 429* 272  163    Cardiac Enzymes: No results for input(s): CKTOTAL, CKMB, CKMBINDEX, TROPONINI in the last 168 hours.  BNP (last 3 results) No results for input(s): BNP in the last 8760 hours.  ProBNP (last 3 results) No results for input(s): PROBNP in the last 8760 hours.  Radiological Exams: DG CHEST PORT 1 VIEW  Result Date: 11/25/2019 CLINICAL DATA:  Hypoxemia EXAM: PORTABLE CHEST 1 VIEW COMPARISON:  11/14/2019 FINDINGS: Interval removal of left PICC line. Enteric tube courses below the diaphragm with distal tip beyond the inferior margin of the film. Stable cardiomegaly. Predominantly basilar interstitial opacities, right worse than left. Findings are similar to minimally improved compared to the prior study. No large pleural fluid collection. No pneumothorax. IMPRESSION: 1. Similar to minimally improved bilateral interstitial opacities, right worse than left. 2. Interval removal of left PICC line. Electronically Signed   By: Duanne Guess D.O.   On: 11/25/2019 11:55    Assessment/Plan Active Problems:   Acute on chronic respiratory failure with hypoxia (HCC)   COVID-19 virus infection   Pneumonia due to COVID-19 virus   Chronic HFrEF (heart failure with reduced ejection fraction) (HCC)   Tracheostomy status (HCC)   1. Acute on chronic respiratory failure hypoxia patient is a 24-hour goal at this time on 20% aerosol trach collar we will continue supportive measures and aggressive pulmonary toilet. 2. COVID-19 virus infection  in recovery phase we'll continue to follow 3. Pneumonia due to COVID-19 has been treated we'll continue with present management 4. Chronic heart failure reduced ejection fraction being followed by cardiology we'll continue with their recommendations 5. Tracheostomy weaning on protocol 12-hour goal   I have personally seen and evaluated the patient, evaluated laboratory and imaging results, formulated the assessment and plan and placed orders. The Patient  requires high complexity decision making with multiple systems involvement.  Rounds were done with the Respiratory Therapy Director and Staff therapists and discussed with nursing staff also.  Yevonne Pax, MD [facility] Pulmonary Critical Care Medicine Sleep Medicine

## 2019-11-27 DIAGNOSIS — Z930 Tracheostomy status: Secondary | ICD-10-CM | POA: Diagnosis not present

## 2019-11-27 DIAGNOSIS — J9621 Acute and chronic respiratory failure with hypoxia: Secondary | ICD-10-CM | POA: Diagnosis not present

## 2019-11-27 DIAGNOSIS — I5022 Chronic systolic (congestive) heart failure: Secondary | ICD-10-CM | POA: Diagnosis not present

## 2019-11-27 DIAGNOSIS — U071 COVID-19: Secondary | ICD-10-CM | POA: Diagnosis not present

## 2019-11-27 LAB — BLOOD GAS, ARTERIAL
Acid-Base Excess: 7.1 mmol/L — ABNORMAL HIGH (ref 0.0–2.0)
Bicarbonate: 31.7 mmol/L — ABNORMAL HIGH (ref 20.0–28.0)
FIO2: 45
O2 Saturation: 94.5 %
Patient temperature: 37
pCO2 arterial: 50.1 mmHg — ABNORMAL HIGH (ref 32.0–48.0)
pH, Arterial: 7.418 (ref 7.350–7.450)
pO2, Arterial: 75.4 mmHg — ABNORMAL LOW (ref 83.0–108.0)

## 2019-11-27 LAB — MAGNESIUM: Magnesium: 2.1 mg/dL (ref 1.7–2.4)

## 2019-11-27 LAB — BASIC METABOLIC PANEL
Anion gap: 9 (ref 5–15)
BUN: 50 mg/dL — ABNORMAL HIGH (ref 8–23)
CO2: 32 mmol/L (ref 22–32)
Calcium: 9.1 mg/dL (ref 8.9–10.3)
Chloride: 108 mmol/L (ref 98–111)
Creatinine, Ser: 0.76 mg/dL (ref 0.61–1.24)
GFR, Estimated: >60 mL/min (ref >60)
Glucose, Bld: 218 mg/dL — ABNORMAL HIGH (ref 70–99)
Potassium: 4.7 mmol/L (ref 3.5–5.1)
Sodium: 149 mmol/L — ABNORMAL HIGH (ref 135–145)

## 2019-11-27 LAB — CBC
HCT: 37.2 % — ABNORMAL LOW (ref 39.0–52.0)
Hemoglobin: 11.5 g/dL — ABNORMAL LOW (ref 13.0–17.0)
MCH: 26.3 pg (ref 26.0–34.0)
MCHC: 30.9 g/dL (ref 30.0–36.0)
MCV: 84.9 fL (ref 80.0–100.0)
Platelets: 143 10*3/uL — ABNORMAL LOW (ref 150–400)
RBC: 4.38 MIL/uL (ref 4.22–5.81)
RDW: 22.2 % — ABNORMAL HIGH (ref 11.5–15.5)
WBC: 13.8 10*3/uL — ABNORMAL HIGH (ref 4.0–10.5)
nRBC: 0 % (ref 0.0–0.2)

## 2019-11-27 NOTE — Progress Notes (Addendum)
Pulmonary Critical Care Medicine [facility] [place]   PULMONARY CRITICAL CARE SERVICE  PROGRESS NOTE  Date of Service: 11/27/2019  Dream Nodal  [id number]:[id number]  DOB: 1950-12-24   DOA: 11/14/2019  Referring Physician: Carron Curie, MD  HPI: Brydon Spahr is a 69 y.o. male seen for follow up of Acute on Chronic Respiratory Failure.  Patient continues on aerosol trach collar at this time does not appear to be in any distress.  Medications: Reviewed on Rounds  Physical Exam:  Vitals: Pulse 65 respirations 17 BP 134/81 O2 sat 98% temp 97.6  Ventilator Settings not currently on ventilator  . General: Comfortable at this time . Eyes: Grossly normal lids, irises & conjunctiva . ENT: grossly tongue is normal . Neck: no obvious mass . Cardiovascular: S1 S2 normal no gallop . Respiratory: No rales or rhonchi noted . Abdomen: soft . Skin: no rash seen on limited exam . Musculoskeletal: not rigid . Psychiatric:unable to assess . Neurologic: no seizure no involuntary movements         Lab Data:   Basic Metabolic Panel: Recent Labs  Lab 11/22/19 0413 11/25/19 0639 11/27/19 0515  NA 146* 146* 149*  K 4.2 4.1 4.7  CL 104 107 108  CO2 32 33* 32  GLUCOSE 249* 192* 218*  BUN 46* 57* 50*  CREATININE 0.82 0.73 0.76  CALCIUM 9.3 9.1 9.1  MG  --  2.1 2.1    ABG: Recent Labs  Lab 11/21/19 1810 11/23/19 1048 11/24/19 1038 11/26/19 0940  PHART 7.511* 7.494* 7.470* 7.488*  PCO2ART 45.5 46.8 49.8* 46.3  PO2ART 53.7* 67.6* 52.0* 55.8*  HCO3 36.4* 35.7* 35.7* 34.8*  O2SAT 88.9 93.5 86.6 89.8    Liver Function Tests: No results for input(s): AST, ALT, ALKPHOS, BILITOT, PROT, ALBUMIN in the last 168 hours. No results for input(s): LIPASE, AMYLASE in the last 168 hours. No results for input(s): AMMONIA in the last 168 hours.  CBC: Recent Labs  Lab 11/22/19 0413 11/25/19 0639 11/27/19 0515  WBC 12.6* 11.9* 13.8*  HGB 10.9* 10.1* 11.5*  HCT 34.3* 33.0* 37.2*   MCV 81.7 84.6 84.9  PLT 272 163 143*    Cardiac Enzymes: No results for input(s): CKTOTAL, CKMB, CKMBINDEX, TROPONINI in the last 168 hours.  BNP (last 3 results) No results for input(s): BNP in the last 8760 hours.  ProBNP (last 3 results) No results for input(s): PROBNP in the last 8760 hours.  Radiological Exams: DG CHEST PORT 1 VIEW  Result Date: 11/25/2019 CLINICAL DATA:  Hypoxemia EXAM: PORTABLE CHEST 1 VIEW COMPARISON:  11/14/2019 FINDINGS: Interval removal of left PICC line. Enteric tube courses below the diaphragm with distal tip beyond the inferior margin of the film. Stable cardiomegaly. Predominantly basilar interstitial opacities, right worse than left. Findings are similar to minimally improved compared to the prior study. No large pleural fluid collection. No pneumothorax. IMPRESSION: 1. Similar to minimally improved bilateral interstitial opacities, right worse than left. 2. Interval removal of left PICC line. Electronically Signed   By: Duanne Guess D.O.   On: 11/25/2019 11:55    Assessment/Plan Active Problems:   Acute on chronic respiratory failure with hypoxia (HCC)   COVID-19 virus infection   Pneumonia due to COVID-19 virus   Chronic HFrEF (heart failure with reduced ejection fraction) (HCC)   Tracheostomy status (HCC)   1. Acute on chronic respiratory failure hypoxia  patient remains on aerosol trach collar for 48 hours at this time recent ABG shows a decreased p.o. to once  again.  Patient will likely have to go back on pressure support continue supportive measures and pulmonary toilet. 2. COVID-19 virus infection in recovery phase we'll continue to follow 3. Pneumonia due to COVID-19 has been treated we'll continue with present management 4. Chronic heart failure reduced ejection fraction being followed by cardiology we'll continue with their recommendations 5. Tracheostomy weaning on protocol 12-hour goal   I have personally seen and evaluated the  patient, evaluated laboratory and imaging results, formulated the assessment and plan and placed orders. The Patient requires high complexity decision making with multiple systems involvement.  Rounds were done with the Respiratory Therapy Director and Staff therapists and discussed with nursing staff also.  Yevonne Pax, MD [facility] Pulmonary Critical Care Medicine Sleep Medicine

## 2019-11-28 DIAGNOSIS — U071 COVID-19: Secondary | ICD-10-CM | POA: Diagnosis not present

## 2019-11-28 DIAGNOSIS — Z930 Tracheostomy status: Secondary | ICD-10-CM | POA: Diagnosis not present

## 2019-11-28 DIAGNOSIS — I5022 Chronic systolic (congestive) heart failure: Secondary | ICD-10-CM | POA: Diagnosis not present

## 2019-11-28 DIAGNOSIS — J9621 Acute and chronic respiratory failure with hypoxia: Secondary | ICD-10-CM | POA: Diagnosis not present

## 2019-11-28 NOTE — Progress Notes (Signed)
Pulmonary Critical Care Medicine [facility] [place]   PULMONARY CRITICAL CARE SERVICE  PROGRESS NOTE  Date of Service: 11/28/2019  Jacob Vasquez  EUM:[id number]  DOB: [birth year]   DOA: 11/14/2019  Referring Physician: Carron Curie, MD  HPI: Jacob Vasquez is a 69 y.o. male seen for follow up of Acute on Chronic Respiratory Failure.  Patient is on T collar currently on 45% FiO2  Medications: Reviewed on Rounds  Physical Exam:  Vitals: Temperature is 98 pulse 90 respiratory rate is 19 blood pressure is 130/79 saturations 98%  Ventilator Settings on T collar FiO2 45%  . General: Comfortable at this time . Eyes: Grossly normal lids, irises & conjunctiva . ENT: grossly tongue is normal . Neck: no obvious mass . Cardiovascular: S1 S2 normal no gallop . Respiratory: No rales are noted at this time . Abdomen: soft . Skin: no rash seen on limited exam . Musculoskeletal: not rigid . Psychiatric:unable to assess . Neurologic: no seizure no involuntary movements         Lab Data:   Basic Metabolic Panel: Recent Labs  Lab 11/22/19 0413 11/25/19 0639 11/27/19 0515  NA 146* 146* 149*  K 4.2 4.1 4.7  CL 104 107 108  CO2 32 33* 32  GLUCOSE 249* 192* 218*  BUN 46* 57* 50*  CREATININE 0.82 0.73 0.76  CALCIUM 9.3 9.1 9.1  MG  --  2.1 2.1    ABG: Recent Labs  Lab 11/21/19 1810 11/23/19 1048 11/24/19 1038 11/26/19 0940 11/27/19 1205  PHART 7.511* 7.494* 7.470* 7.488* 7.418  PCO2ART 45.5 46.8 49.8* 46.3 50.1*  PO2ART 53.7* 67.6* 52.0* 55.8* 75.4*  HCO3 36.4* 35.7* 35.7* 34.8* 31.7*  O2SAT 88.9 93.5 86.6 89.8 94.5    Liver Function Tests: No results for input(s): AST, ALT, ALKPHOS, BILITOT, PROT, ALBUMIN in the last 168 hours. No results for input(s): LIPASE, AMYLASE in the last 168 hours. No results for input(s): AMMONIA in the last 168 hours.  CBC: Recent Labs  Lab 11/22/19 0413 11/25/19 0639 11/27/19 0515  WBC 12.6* 11.9* 13.8*  HGB 10.9* 10.1*  11.5*  HCT 34.3* 33.0* 37.2*  MCV 81.7 84.6 84.9  PLT 272 163 143*    Cardiac Enzymes: No results for input(s): CKTOTAL, CKMB, CKMBINDEX, TROPONINI in the last 168 hours.  BNP (last 3 results) No results for input(s): BNP in the last 8760 hours.  ProBNP (last 3 results) No results for input(s): PROBNP in the last 8760 hours.  Radiological Exams: No results found.  Assessment/Plan Active Problems:   Acute on chronic respiratory failure with hypoxia (HCC)   COVID-19 virus infection   Pneumonia due to COVID-19 virus   Chronic HFrEF (heart failure with reduced ejection fraction) (HCC)   Tracheostomy status (HCC)   1. Acute on chronic respiratory failure with hypoxia we will continue with T collar trials and do it today as tolerated 2. COVID-19 virus infection in recovery phase we will continue supportive care 3. Chronic heart failure reduced ejection fraction supportive care and follow-up on cardiology recommendations 4. Pneumonia due to COVID-19 slow improvement 5. Tracheostomy working towards PMV and capping hopefully   I have personally seen and evaluated the patient, evaluated laboratory and imaging results, formulated the assessment and plan and placed orders. The Patient requires high complexity decision making with multiple systems involvement.  Rounds were done with the Respiratory Therapy Director and Staff therapists and discussed with nursing staff also.  Yevonne Pax, MD [facility] Pulmonary Critical Care Medicine Sleep Medicine

## 2019-11-29 DIAGNOSIS — J9621 Acute and chronic respiratory failure with hypoxia: Secondary | ICD-10-CM | POA: Diagnosis not present

## 2019-11-29 DIAGNOSIS — I5022 Chronic systolic (congestive) heart failure: Secondary | ICD-10-CM | POA: Diagnosis not present

## 2019-11-29 DIAGNOSIS — U071 COVID-19: Secondary | ICD-10-CM | POA: Diagnosis not present

## 2019-11-29 DIAGNOSIS — Z930 Tracheostomy status: Secondary | ICD-10-CM | POA: Diagnosis not present

## 2019-11-29 LAB — BLOOD GAS, ARTERIAL
Acid-Base Excess: 9.5 mmol/L — ABNORMAL HIGH (ref 0.0–2.0)
Bicarbonate: 33.9 mmol/L — ABNORMAL HIGH (ref 20.0–28.0)
FIO2: 35
O2 Saturation: 93.4 %
Patient temperature: 36.8
pCO2 arterial: 49.1 mmHg — ABNORMAL HIGH (ref 32.0–48.0)
pH, Arterial: 7.452 — ABNORMAL HIGH (ref 7.350–7.450)
pO2, Arterial: 65.3 mmHg — ABNORMAL LOW (ref 83.0–108.0)

## 2019-11-29 NOTE — Progress Notes (Addendum)
Pulmonary Critical Care Medicine [facility] [place]   PULMONARY CRITICAL CARE SERVICE  PROGRESS NOTE  Date of Service: 11/29/2019  Jacob Vasquez  [id number]:[id number]  DOB: 1950/04/18   DOA: 11/14/2019  Referring Physician: Carron Curie, MD  HPI: Jacob Vasquez is a 69 y.o. male seen for follow up of Acute on Chronic Respiratory Failure.  Patient is on T-bar 35% and is completing 24-hour goal at this time satting well no distress.  Medications: Reviewed on Rounds  Physical Exam:  Vitals: Pulse 73 respirations 20 BP 142/87 O2 sat 96% temp 98.1  Ventilator Settings T-bar 35%  . General: Comfortable at this time . Eyes: Grossly normal lids, irises & conjunctiva . ENT: grossly tongue is normal . Neck: no obvious mass . Cardiovascular: S1 S2 normal no gallop . Respiratory: No rales or rhonchi noted . Abdomen: soft . Skin: no rash seen on limited exam . Musculoskeletal: not rigid . Psychiatric:unable to assess . Neurologic: no seizure no involuntary movements         Lab Data:   Basic Metabolic Panel: Recent Labs  Lab 11/25/19 0639 11/27/19 0515  NA 146* 149*  K 4.1 4.7  CL 107 108  CO2 33* 32  GLUCOSE 192* 218*  BUN 57* 50*  CREATININE 0.73 0.76  CALCIUM 9.1 9.1  MG 2.1 2.1    ABG: Recent Labs  Lab 11/23/19 1048 11/24/19 1038 11/26/19 0940 11/27/19 1205 11/29/19 0940  PHART 7.494* 7.470* 7.488* 7.418 7.452*  PCO2ART 46.8 49.8* 46.3 50.1* 49.1*  PO2ART 67.6* 52.0* 55.8* 75.4* 65.3*  HCO3 35.7* 35.7* 34.8* 31.7* 33.9*  O2SAT 93.5 86.6 89.8 94.5 93.4    Liver Function Tests: No results for input(s): AST, ALT, ALKPHOS, BILITOT, PROT, ALBUMIN in the last 168 hours. No results for input(s): LIPASE, AMYLASE in the last 168 hours. No results for input(s): AMMONIA in the last 168 hours.  CBC: Recent Labs  Lab 11/25/19 0639 11/27/19 0515  WBC 11.9* 13.8*  HGB 10.1* 11.5*  HCT 33.0* 37.2*  MCV 84.6 84.9  PLT 163 143*    Cardiac Enzymes: No  results for input(s): CKTOTAL, CKMB, CKMBINDEX, TROPONINI in the last 168 hours.  BNP (last 3 results) No results for input(s): BNP in the last 8760 hours.  ProBNP (last 3 results) No results for input(s): PROBNP in the last 8760 hours.  Radiological Exams: No results found.  Assessment/Plan Active Problems:   Acute on chronic respiratory failure with hypoxia (HCC)   COVID-19 virus infection   Pneumonia due to COVID-19 virus   Chronic HFrEF (heart failure with reduced ejection fraction) (HCC)   Tracheostomy status (HCC)   1. Acute on chronic respiratory failure with hypoxia patient will continue on T-bar with a current goal 48 hours as he is completing the 24-hour goal.  We will continue supportive measures and aggressive pulmonary toilet. 2. COVID-19 virus infection in recovery phase we will continue supportive care 3. Chronic heart failure reduced ejection fraction supportive care and follow-up on cardiology recommendations 4. Pneumonia due to COVID-19 slow improvement 5. Tracheostomy working towards PMV and capping hopefully   I have personally seen and evaluated the patient, evaluated laboratory and imaging results, formulated the assessment and plan and placed orders. The Patient requires high complexity decision making with multiple systems involvement.  Rounds were done with the Respiratory Therapy Director and Staff therapists and discussed with nursing staff also.  Yevonne Pax, MD [facility] Pulmonary Critical Care Medicine Sleep Medicine

## 2019-11-30 ENCOUNTER — Other Ambulatory Visit (HOSPITAL_COMMUNITY): Payer: Medicare Other

## 2019-11-30 DIAGNOSIS — I5022 Chronic systolic (congestive) heart failure: Secondary | ICD-10-CM | POA: Diagnosis not present

## 2019-11-30 DIAGNOSIS — U071 COVID-19: Secondary | ICD-10-CM | POA: Diagnosis not present

## 2019-11-30 DIAGNOSIS — Z930 Tracheostomy status: Secondary | ICD-10-CM | POA: Diagnosis not present

## 2019-11-30 DIAGNOSIS — J9621 Acute and chronic respiratory failure with hypoxia: Secondary | ICD-10-CM | POA: Diagnosis not present

## 2019-11-30 LAB — URINALYSIS, ROUTINE W REFLEX MICROSCOPIC
Bacteria, UA: NONE SEEN
Bilirubin Urine: NEGATIVE
Glucose, UA: 50 mg/dL — AB
Ketones, ur: NEGATIVE mg/dL
Nitrite: NEGATIVE
Protein, ur: NEGATIVE mg/dL
Specific Gravity, Urine: 1.02 (ref 1.005–1.030)
WBC, UA: >50 WBC/hpf — ABNORMAL HIGH (ref 0–5)
pH: 6 (ref 5.0–8.0)

## 2019-11-30 LAB — CBC
HCT: 29.1 % — ABNORMAL LOW (ref 39.0–52.0)
Hemoglobin: 9.1 g/dL — ABNORMAL LOW (ref 13.0–17.0)
MCH: 26.6 pg (ref 26.0–34.0)
MCHC: 31.3 g/dL (ref 30.0–36.0)
MCV: 85.1 fL (ref 80.0–100.0)
Platelets: 97 10*3/uL — ABNORMAL LOW (ref 150–400)
RBC: 3.42 MIL/uL — ABNORMAL LOW (ref 4.22–5.81)
RDW: 22.4 % — ABNORMAL HIGH (ref 11.5–15.5)
WBC: 20.3 10*3/uL — ABNORMAL HIGH (ref 4.0–10.5)
nRBC: 0 % (ref 0.0–0.2)

## 2019-11-30 LAB — BASIC METABOLIC PANEL
Anion gap: 6 (ref 5–15)
BUN: 44 mg/dL — ABNORMAL HIGH (ref 8–23)
CO2: 31 mmol/L (ref 22–32)
Calcium: 8.5 mg/dL — ABNORMAL LOW (ref 8.9–10.3)
Chloride: 104 mmol/L (ref 98–111)
Creatinine, Ser: 0.73 mg/dL (ref 0.61–1.24)
GFR, Estimated: >60 mL/min (ref >60)
Glucose, Bld: 206 mg/dL — ABNORMAL HIGH (ref 70–99)
Potassium: 4.5 mmol/L (ref 3.5–5.1)
Sodium: 141 mmol/L (ref 135–145)

## 2019-11-30 NOTE — Progress Notes (Signed)
Pulmonary Critical Care Medicine [facility] [place]   PULMONARY CRITICAL CARE SERVICE  PROGRESS NOTE  Date of Service: 11/30/2019  Jacob Vasquez  [id number]:[id number]  DOB: 12-24-1950   DOA: 11/14/2019  Referring Physician: Carron Curie, MD  HPI: Jacob Vasquez is a 69 y.o. male seen for follow up of Acute on Chronic Respiratory Failure.  Patient currently is on T collar has been on 50% FiO2 with good saturations.  Medications: Reviewed on Rounds  Physical Exam:  Vitals: Temperature is 96.9 pulse 79 respiratory 22 blood pressure is 137/83 saturations 94%  Ventilator Settings on T collar FiO2 50% with PMV  . General: Comfortable at this time . Eyes: Grossly normal lids, irises & conjunctiva . ENT: grossly tongue is normal . Neck: no obvious mass . Cardiovascular: S1 S2 normal no gallop . Respiratory: No rhonchi or rales are noted at this time . Abdomen: soft . Skin: no rash seen on limited exam . Musculoskeletal: not rigid . Psychiatric:unable to assess . Neurologic: no seizure no involuntary movements         Lab Data:   Basic Metabolic Panel: Recent Labs  Lab 11/25/19 0639 11/27/19 0515  NA 146* 149*  K 4.1 4.7  CL 107 108  CO2 33* 32  GLUCOSE 192* 218*  BUN 57* 50*  CREATININE 0.73 0.76  CALCIUM 9.1 9.1  MG 2.1 2.1    ABG: Recent Labs  Lab 11/23/19 1048 11/24/19 1038 11/26/19 0940 11/27/19 1205 11/29/19 0940  PHART 7.494* 7.470* 7.488* 7.418 7.452*  PCO2ART 46.8 49.8* 46.3 50.1* 49.1*  PO2ART 67.6* 52.0* 55.8* 75.4* 65.3*  HCO3 35.7* 35.7* 34.8* 31.7* 33.9*  O2SAT 93.5 86.6 89.8 94.5 93.4    Liver Function Tests: No results for input(s): AST, ALT, ALKPHOS, BILITOT, PROT, ALBUMIN in the last 168 hours. No results for input(s): LIPASE, AMYLASE in the last 168 hours. No results for input(s): AMMONIA in the last 168 hours.  CBC: Recent Labs  Lab 11/25/19 0639 11/27/19 0515  WBC 11.9* 13.8*  HGB 10.1* 11.5*  HCT 33.0* 37.2*  MCV 84.6  84.9  PLT 163 143*    Cardiac Enzymes: No results for input(s): CKTOTAL, CKMB, CKMBINDEX, TROPONINI in the last 168 hours.  BNP (last 3 results) No results for input(s): BNP in the last 8760 hours.  ProBNP (last 3 results) No results for input(s): PROBNP in the last 8760 hours.  Radiological Exams: No results found.  Assessment/Plan Active Problems:   Acute on chronic respiratory failure with hypoxia (HCC)   COVID-19 virus infection   Pneumonia due to COVID-19 virus   Chronic HFrEF (heart failure with reduced ejection fraction) (HCC)   Tracheostomy status (HCC)   1. Acute on chronic respiratory failure with hypoxia we will continue with T collar trials 50% FiO2 patient should be able to use the PMV valve 2. COVID-19 virus infection in recovery phase we will continue to follow 3. To monitor COVID-19 slow improvement follow x-rays 4. Chronic heart failure reduced ejection fraction appears to be compensated cardiology is following 5. Tracheostomy working towards PMV and capping   I have personally seen and evaluated the patient, evaluated laboratory and imaging results, formulated the assessment and plan and placed orders. The Patient requires high complexity decision making with multiple systems involvement.  Rounds were done with the Respiratory Therapy Director and Staff therapists and discussed with nursing staff also.  Yevonne Pax, MD [facility] Pulmonary Critical Care Medicine Sleep Medicine

## 2019-12-01 DIAGNOSIS — Z930 Tracheostomy status: Secondary | ICD-10-CM | POA: Diagnosis not present

## 2019-12-01 DIAGNOSIS — U071 COVID-19: Secondary | ICD-10-CM | POA: Diagnosis not present

## 2019-12-01 DIAGNOSIS — I5022 Chronic systolic (congestive) heart failure: Secondary | ICD-10-CM | POA: Diagnosis not present

## 2019-12-01 DIAGNOSIS — J9621 Acute and chronic respiratory failure with hypoxia: Secondary | ICD-10-CM | POA: Diagnosis not present

## 2019-12-01 NOTE — Progress Notes (Signed)
Pulmonary Critical Care Medicine [facility] [place]   PULMONARY CRITICAL CARE SERVICE  PROGRESS NOTE  Date of Service: 12/01/2019  Jacob Vasquez  GEX:[id number]  DOB: [birth year]   DOA: 11/14/2019  Referring Physician: Carron Curie, MD  HPI: Jacob Vasquez is a 69 y.o. male seen for follow up of Acute on Chronic Respiratory Failure.  Patient apparently had some desaturations at night right now remains on T collar and is on 40% FiO2  Medications: Reviewed on Rounds  Physical Exam:  Vitals: Temperature 96.7 pulse 82 respiratory rate is 20 blood pressure is 124/81 saturations 93%  Ventilator Settings off ventilator on T collar with an FiO2 of 40%  . General: Comfortable at this time . Eyes: Grossly normal lids, irises & conjunctiva . ENT: grossly tongue is normal . Neck: no obvious mass . Cardiovascular: S1 S2 normal no gallop . Respiratory: No rhonchi no rales are noted at this time . Abdomen: soft . Skin: no rash seen on limited exam . Musculoskeletal: not rigid . Psychiatric:unable to assess . Neurologic: no seizure no involuntary movements         Lab Data:   Basic Metabolic Panel: Recent Labs  Lab 11/25/19 0639 11/27/19 0515 11/30/19 1116  NA 146* 149* 141  K 4.1 4.7 4.5  CL 107 108 104  CO2 33* 32 31  GLUCOSE 192* 218* 206*  BUN 57* 50* 44*  CREATININE 0.73 0.76 0.73  CALCIUM 9.1 9.1 8.5*  MG 2.1 2.1  --     ABG: Recent Labs  Lab 11/24/19 1038 11/26/19 0940 11/27/19 1205 11/29/19 0940  PHART 7.470* 7.488* 7.418 7.452*  PCO2ART 49.8* 46.3 50.1* 49.1*  PO2ART 52.0* 55.8* 75.4* 65.3*  HCO3 35.7* 34.8* 31.7* 33.9*  O2SAT 86.6 89.8 94.5 93.4    Liver Function Tests: No results for input(s): AST, ALT, ALKPHOS, BILITOT, PROT, ALBUMIN in the last 168 hours. No results for input(s): LIPASE, AMYLASE in the last 168 hours. No results for input(s): AMMONIA in the last 168 hours.  CBC: Recent Labs  Lab 11/25/19 0639 11/27/19 0515  11/30/19 1116  WBC 11.9* 13.8* 20.3*  HGB 10.1* 11.5* 9.1*  HCT 33.0* 37.2* 29.1*  MCV 84.6 84.9 85.1  PLT 163 143* 97*    Cardiac Enzymes: No results for input(s): CKTOTAL, CKMB, CKMBINDEX, TROPONINI in the last 168 hours.  BNP (last 3 results) No results for input(s): BNP in the last 8760 hours.  ProBNP (last 3 results) No results for input(s): PROBNP in the last 8760 hours.  Radiological Exams: DG Chest Port 1 View  Result Date: 11/30/2019 CLINICAL DATA:  Pneumonia EXAM: PORTABLE CHEST 1 VIEW COMPARISON:  Chest x-ray dated 11/25/2019 FINDINGS: Stable heart size and mediastinal contours. Tracheostomy has been placed with tip appropriately positioned in the midline. Enteric tube passes below the diaphragm. Patchy interstitial opacities are again seen bilaterally, perhaps slightly improved compared to an earlier chest x-ray of 11/14/2019, not appreciably changed compared to the most recent chest x-ray. No new lung findings. No pleural effusion or pneumothorax is seen. IMPRESSION: 1. Patchy interstitial opacities bilaterally, edema versus atypical pneumonia, perhaps slightly improved compared to an earlier chest x-ray of 11/14/2019, not appreciably changed compared to the most recent chest x-ray of 11/25/2019. 2. Tracheostomy has been placed with tip appropriately positioned in the midline. No pneumothorax. Electronically Signed   By: Bary Richard M.D.   On: 11/30/2019 10:16    Assessment/Plan Active Problems:   Acute on chronic respiratory failure with hypoxia (HCC)  COVID-19 virus infection   Pneumonia due to COVID-19 virus   Chronic HFrEF (heart failure with reduced ejection fraction) (HCC)   Tracheostomy status (HCC)   1. Acute on chronic respiratory failure hypoxia plan is to continue with the weaning process patient will be attempted on PMV today. 2. COVID-19 virus infection in recovery phase we will continue to follow 3. Pneumonia due to COVID-19 has been  treated 4. Chronic heart failure reduced ejection fraction continue to follow 5. Tracheostomy remains in place   I have personally seen and evaluated the patient, evaluated laboratory and imaging results, formulated the assessment and plan and placed orders. The Patient requires high complexity decision making with multiple systems involvement.  Rounds were done with the Respiratory Therapy Director and Staff therapists and discussed with nursing staff also.  Yevonne Pax, MD [facility] Pulmonary Critical Care Medicine Sleep Medicine

## 2019-12-02 ENCOUNTER — Other Ambulatory Visit (HOSPITAL_COMMUNITY): Payer: Medicare Other

## 2019-12-02 DIAGNOSIS — Z930 Tracheostomy status: Secondary | ICD-10-CM | POA: Diagnosis not present

## 2019-12-02 DIAGNOSIS — I5022 Chronic systolic (congestive) heart failure: Secondary | ICD-10-CM | POA: Diagnosis not present

## 2019-12-02 DIAGNOSIS — U071 COVID-19: Secondary | ICD-10-CM | POA: Diagnosis not present

## 2019-12-02 DIAGNOSIS — J9621 Acute and chronic respiratory failure with hypoxia: Secondary | ICD-10-CM | POA: Diagnosis not present

## 2019-12-02 NOTE — Progress Notes (Signed)
Pulmonary Critical Care Medicine [facility] [place]   PULMONARY CRITICAL CARE SERVICE  PROGRESS NOTE  Date of Service: 12/02/2019  Salome Cozby  [id number]:[id number]  DOB: Apr 24, 1950   DOA: 11/14/2019  Referring Physician: Carron Curie, MD  HPI: Lister Brizzi is a 69 y.o. male seen for follow up of Acute on Chronic Respiratory Failure.  Patient is on T collar currently on 35% FiO2 secretions are fair to moderate.  Has been tolerating T collar fairly well  Medications: Reviewed on Rounds  Physical Exam:  Vitals: Temperature is 97.5 pulse 71 respiratory rate 21 blood pressure is 148/89 saturations 96%  Ventilator Settings on T collar with an FiO2 of 35%   General: Comfortable at this time  Eyes: Grossly normal lids, irises & conjunctiva  ENT: grossly tongue is normal  Neck: no obvious mass  Cardiovascular: S1 S2 normal no gallop  Respiratory: Scattered rhonchi very coarse breath sounds  Abdomen: soft  Skin: no rash seen on limited exam  Musculoskeletal: not rigid  Psychiatric:unable to assess  Neurologic: no seizure no involuntary movements         Lab Data:   Basic Metabolic Panel: Recent Labs  Lab 11/27/19 0515 11/30/19 1116  NA 149* 141  K 4.7 4.5  CL 108 104  CO2 32 31  GLUCOSE 218* 206*  BUN 50* 44*  CREATININE 0.76 0.73  CALCIUM 9.1 8.5*  MG 2.1  --     ABG: Recent Labs  Lab 11/26/19 0940 11/27/19 1205 11/29/19 0940  PHART 7.488* 7.418 7.452*  PCO2ART 46.3 50.1* 49.1*  PO2ART 55.8* 75.4* 65.3*  HCO3 34.8* 31.7* 33.9*  O2SAT 89.8 94.5 93.4    Liver Function Tests: No results for input(s): AST, ALT, ALKPHOS, BILITOT, PROT, ALBUMIN in the last 168 hours. No results for input(s): LIPASE, AMYLASE in the last 168 hours. No results for input(s): AMMONIA in the last 168 hours.  CBC: Recent Labs  Lab 11/27/19 0515 11/30/19 1116  WBC 13.8* 20.3*  HGB 11.5* 9.1*  HCT 37.2* 29.1*  MCV 84.9 85.1  PLT 143* 97*    Cardiac  Enzymes: No results for input(s): CKTOTAL, CKMB, CKMBINDEX, TROPONINI in the last 168 hours.  BNP (last 3 results) No results for input(s): BNP in the last 8760 hours.  ProBNP (last 3 results) No results for input(s): PROBNP in the last 8760 hours.  Radiological Exams: No results found.  Assessment/Plan Active Problems:   Acute on chronic respiratory failure with hypoxia (HCC)   COVID-19 virus infection   Pneumonia due to COVID-19 virus   Chronic HFrEF (heart failure with reduced ejection fraction) (HCC)   Tracheostomy status (HCC)   1. Acute on chronic respiratory failure hypoxia we will continue with T-piece.  Patient currently is requiring 35% FiO2 will titrate down as tolerated. 2. COVID-19 virus infection in recovery we will continue with supportive care 3. Pneumonia due to COVID-19 treated slow improvement. 4. Chronic heart failure reduced ejection fraction at baseline monitor fluid status 5. Tracheostomy remains in place we will continue with supportive care   I have personally seen and evaluated the patient, evaluated laboratory and imaging results, formulated the assessment and plan and placed orders. The Patient requires high complexity decision making with multiple systems involvement.  Rounds were done with the Respiratory Therapy Director and Staff therapists and discussed with nursing staff also.  Yevonne Pax, MD [facility] Pulmonary Critical Care Medicine Sleep Medicine

## 2019-12-03 DIAGNOSIS — U071 COVID-19: Secondary | ICD-10-CM | POA: Diagnosis not present

## 2019-12-03 DIAGNOSIS — J9621 Acute and chronic respiratory failure with hypoxia: Secondary | ICD-10-CM | POA: Diagnosis not present

## 2019-12-03 DIAGNOSIS — Z930 Tracheostomy status: Secondary | ICD-10-CM | POA: Diagnosis not present

## 2019-12-03 DIAGNOSIS — I5022 Chronic systolic (congestive) heart failure: Secondary | ICD-10-CM | POA: Diagnosis not present

## 2019-12-03 LAB — URINE CULTURE: Culture: 40000 — AB

## 2019-12-03 NOTE — Progress Notes (Signed)
Pulmonary Critical Care Medicine [facility] [place]   PULMONARY CRITICAL CARE SERVICE  PROGRESS NOTE  Date of Service: 12/03/2019  Flint Hakeem  [id number]:[id number]  DOB: [birth year]   DOA: 11/14/2019  Referring Physician: Carron Curie, MD  HPI: Tyjuan Demetro is a 69 y.o. male seen for follow up of Acute on Chronic Respiratory Failure.  Patient this time is on T collar has been on 50% FiO2 secretions are fairly moderate  Medications: Reviewed on Rounds  Physical Exam:  Vitals: Temperature 96.4 pulse 73 respiratory 20 blood pressure is 135/72 saturations 94%  Ventilator Settings on T collar FiO2 of 50%  . General: Comfortable at this time . Eyes: Grossly normal lids, irises & conjunctiva . ENT: grossly tongue is normal . Neck: no obvious mass . Cardiovascular: S1 S2 normal no gallop . Respiratory: No rhonchi no rales noted at this time . Abdomen: soft . Skin: no rash seen on limited exam . Musculoskeletal: not rigid . Psychiatric:unable to assess . Neurologic: no seizure no involuntary movements         Lab Data:   Basic Metabolic Panel: Recent Labs  Lab 11/27/19 0515 11/30/19 1116  NA 149* 141  K 4.7 4.5  CL 108 104  CO2 32 31  GLUCOSE 218* 206*  BUN 50* 44*  CREATININE 0.76 0.73  CALCIUM 9.1 8.5*  MG 2.1  --     ABG: Recent Labs  Lab 11/27/19 1205 11/29/19 0940  PHART 7.418 7.452*  PCO2ART 50.1* 49.1*  PO2ART 75.4* 65.3*  HCO3 31.7* 33.9*  O2SAT 94.5 93.4    Liver Function Tests: No results for input(s): AST, ALT, ALKPHOS, BILITOT, PROT, ALBUMIN in the last 168 hours. No results for input(s): LIPASE, AMYLASE in the last 168 hours. No results for input(s): AMMONIA in the last 168 hours.  CBC: Recent Labs  Lab 11/27/19 0515 11/30/19 1116  WBC 13.8* 20.3*  HGB 11.5* 9.1*  HCT 37.2* 29.1*  MCV 84.9 85.1  PLT 143* 97*    Cardiac Enzymes: No results for input(s): CKTOTAL, CKMB, CKMBINDEX, TROPONINI in the last 168 hours.  BNP (last  3 results) No results for input(s): BNP in the last 8760 hours.  ProBNP (last 3 results) No results for input(s): PROBNP in the last 8760 hours.  Radiological Exams: No results found.  Assessment/Plan Active Problems:   Acute on chronic respiratory failure with hypoxia (HCC)   COVID-19 virus infection   Pneumonia due to COVID-19 virus   Chronic HFrEF (heart failure with reduced ejection fraction) (HCC)   Tracheostomy status (HCC)   1. Acute on chronic respiratory failure hypoxia continue with the T collar wean patient is on 50% FiO2 try to titrate oxygen down. 2. COVID-19 virus infection recovery phase will continue to follow 3. Pneumonia due to COVID-19 treated we will continue present management 4. Chronic heart failure with ejection fraction supportive care 5. Tracheostomy remains in place we will continue to follow   I have personally seen and evaluated the patient, evaluated laboratory and imaging results, formulated the assessment and plan and placed orders. The Patient requires high complexity decision making with multiple systems involvement.  Rounds were done with the Respiratory Therapy Director and Staff therapists and discussed with nursing staff also.  Yevonne Pax, MD [facility] Pulmonary Critical Care Medicine Sleep Medicine

## 2019-12-04 DIAGNOSIS — J9621 Acute and chronic respiratory failure with hypoxia: Secondary | ICD-10-CM | POA: Diagnosis not present

## 2019-12-04 DIAGNOSIS — I5022 Chronic systolic (congestive) heart failure: Secondary | ICD-10-CM | POA: Diagnosis not present

## 2019-12-04 DIAGNOSIS — U071 COVID-19: Secondary | ICD-10-CM | POA: Diagnosis not present

## 2019-12-04 DIAGNOSIS — Z930 Tracheostomy status: Secondary | ICD-10-CM | POA: Diagnosis not present

## 2019-12-04 NOTE — Progress Notes (Signed)
Pulmonary Critical Care Medicine [facility] [place]   PULMONARY CRITICAL CARE SERVICE  PROGRESS NOTE  Date of Service: 12/04/2019  Jacob Vasquez  [id number]  DOB: Jan 09, 1951   DOA: 11/14/2019  Referring Physician: Carron Curie, MD  HPI: Jacob Vasquez is a 69 y.o. male seen for follow up of Acute on Chronic Respiratory Failure.  Patient currently is on T collar has been on 50% FiO2 with good saturations.  Medications: Reviewed on Rounds  Physical Exam:  Vitals: Temperature is 96.5 pulse 75 respiratory rate is 23 blood pressure is 140/93 saturations 99%  Ventilator Settings off the ventilator on T collar FiO2 50%  . General: Comfortable at this time . Eyes: Grossly normal lids, irises & conjunctiva . ENT: grossly tongue is normal . Neck: no obvious mass . Cardiovascular: S1 S2 normal no gallop . Respiratory: No rhonchi very coarse breath sounds . Abdomen: soft . Skin: no rash seen on limited exam . Musculoskeletal: not rigid . Psychiatric:unable to assess . Neurologic: no seizure no involuntary movements         Lab Data:   Basic Metabolic Panel: Recent Labs  Lab 11/30/19 1116  NA 141  K 4.5  CL 104  CO2 31  GLUCOSE 206*  BUN 44*  CREATININE 0.73  CALCIUM 8.5*    ABG: Recent Labs  Lab 11/29/19 0940  PHART 7.452*  PCO2ART 49.1*  PO2ART 65.3*  HCO3 33.9*  O2SAT 93.4    Liver Function Tests: No results for input(s): AST, ALT, ALKPHOS, BILITOT, PROT, ALBUMIN in the last 168 hours. No results for input(s): LIPASE, AMYLASE in the last 168 hours. No results for input(s): AMMONIA in the last 168 hours.  CBC: Recent Labs  Lab 11/30/19 1116  WBC 20.3*  HGB 9.1*  HCT 29.1*  MCV 85.1  PLT 97*    Cardiac Enzymes: No results for input(s): CKTOTAL, CKMB, CKMBINDEX, TROPONINI in the last 168 hours.  BNP (last 3 results) No results for input(s): BNP in the last 8760 hours.  ProBNP (last 3 results) No results for input(s): PROBNP in the  last 8760 hours.  Radiological Exams: No results found.  Assessment/Plan Active Problems:   Acute on chronic respiratory failure with hypoxia (HCC)   COVID-19 virus infection   Pneumonia due to COVID-19 virus   Chronic HFrEF (heart failure with reduced ejection fraction) (HCC)   Tracheostomy status (HCC)   1. Acute on chronic respiratory failure hypoxia we will continue with the T collar wean as ordered right now still on 50% FiO2 try to decrease the oxygen. 2. COVID-19 virus infection in recovery 3. Pneumonia due to COVID-19 treated slow improvement 4. Chronic heart failure reduced ejection fraction monitoring fluid status diuresis as tolerated 5. Tracheostomy remains in place   I have personally seen and evaluated the patient, evaluated laboratory and imaging results, formulated the assessment and plan and placed orders. The Patient requires high complexity decision making with multiple systems involvement.  Rounds were done with the Respiratory Therapy Director and Staff therapists and discussed with nursing staff also.  Yevonne Pax, MD [facility] Pulmonary Critical Care Medicine Sleep Medicine

## 2019-12-05 ENCOUNTER — Other Ambulatory Visit (HOSPITAL_COMMUNITY): Payer: Medicare Other

## 2019-12-05 DIAGNOSIS — U071 COVID-19: Secondary | ICD-10-CM | POA: Diagnosis not present

## 2019-12-05 DIAGNOSIS — J9621 Acute and chronic respiratory failure with hypoxia: Secondary | ICD-10-CM | POA: Diagnosis not present

## 2019-12-05 DIAGNOSIS — Z930 Tracheostomy status: Secondary | ICD-10-CM | POA: Diagnosis not present

## 2019-12-05 DIAGNOSIS — I5022 Chronic systolic (congestive) heart failure: Secondary | ICD-10-CM | POA: Diagnosis not present

## 2019-12-05 LAB — BASIC METABOLIC PANEL
Anion gap: 8 (ref 5–15)
BUN: 31 mg/dL — ABNORMAL HIGH (ref 8–23)
CO2: 25 mmol/L (ref 22–32)
Calcium: 8 mg/dL — ABNORMAL LOW (ref 8.9–10.3)
Chloride: 101 mmol/L (ref 98–111)
Creatinine, Ser: 0.78 mg/dL (ref 0.61–1.24)
GFR, Estimated: >60 mL/min (ref >60)
Glucose, Bld: 147 mg/dL — ABNORMAL HIGH (ref 70–99)
Potassium: 3.4 mmol/L — ABNORMAL LOW (ref 3.5–5.1)
Sodium: 134 mmol/L — ABNORMAL LOW (ref 135–145)

## 2019-12-05 LAB — CBC
HCT: 32.2 % — ABNORMAL LOW (ref 39.0–52.0)
Hemoglobin: 10.3 g/dL — ABNORMAL LOW (ref 13.0–17.0)
MCH: 26.8 pg (ref 26.0–34.0)
MCHC: 32 g/dL (ref 30.0–36.0)
MCV: 83.9 fL (ref 80.0–100.0)
Platelets: 105 10*3/uL — ABNORMAL LOW (ref 150–400)
RBC: 3.84 MIL/uL — ABNORMAL LOW (ref 4.22–5.81)
RDW: 23.3 % — ABNORMAL HIGH (ref 11.5–15.5)
WBC: 9 10*3/uL (ref 4.0–10.5)
nRBC: 0 % (ref 0.0–0.2)

## 2019-12-05 LAB — BLOOD GAS, ARTERIAL
Acid-Base Excess: 3.5 mmol/L — ABNORMAL HIGH (ref 0.0–2.0)
Bicarbonate: 26.5 mmol/L (ref 20.0–28.0)
FIO2: 40
O2 Saturation: 96.2 %
Patient temperature: 36.8
pCO2 arterial: 33.5 mmHg (ref 32.0–48.0)
pH, Arterial: 7.51 — ABNORMAL HIGH (ref 7.350–7.450)
pO2, Arterial: 74.5 mmHg — ABNORMAL LOW (ref 83.0–108.0)

## 2019-12-05 LAB — BRAIN NATRIURETIC PEPTIDE: B Natriuretic Peptide: 1504.9 pg/mL — ABNORMAL HIGH (ref 0.0–100.0)

## 2019-12-05 LAB — CULTURE, BLOOD (ROUTINE X 2)
Culture: NO GROWTH
Special Requests: ADEQUATE

## 2019-12-05 NOTE — Progress Notes (Signed)
Pulmonary Critical Care Medicine [facility] [place]   PULMONARY CRITICAL CARE SERVICE  PROGRESS NOTE  Date of Service: 12/05/2019  Jacob Vasquez  [id number]  DOB: 1950/06/09   DOA: 11/14/2019  Referring Physician: Carron Curie, MD  HPI: Jacob Vasquez is a 69 y.o. male seen for follow up of Acute on Chronic Respiratory Failure.  Patient currently is on T collar has been on 50% FiO2 with good saturations.  Should be able to proceed to capping with high flow nasal cannula  Medications: Reviewed on Rounds  Physical Exam:  Vitals: Temperature 96.6 pulse 82 respiratory rate is 26 blood pressure is 115/70 saturations 97%  Ventilator Settings on T collar  . General: Comfortable at this time . Eyes: Grossly normal lids, irises & conjunctiva . ENT: grossly tongue is normal . Neck: no obvious mass . Cardiovascular: S1 S2 normal no gallop . Respiratory: Scattered rhonchi . Abdomen: soft . Skin: no rash seen on limited exam . Musculoskeletal: not rigid . Psychiatric:unable to assess . Neurologic: no seizure no involuntary movements         Lab Data:   Basic Metabolic Panel: Recent Labs  Lab 11/30/19 1116  NA 141  K 4.5  CL 104  CO2 31  GLUCOSE 206*  BUN 44*  CREATININE 0.73  CALCIUM 8.5*    ABG: Recent Labs  Lab 11/29/19 0940  PHART 7.452*  PCO2ART 49.1*  PO2ART 65.3*  HCO3 33.9*  O2SAT 93.4    Liver Function Tests: No results for input(s): AST, ALT, ALKPHOS, BILITOT, PROT, ALBUMIN in the last 168 hours. No results for input(s): LIPASE, AMYLASE in the last 168 hours. No results for input(s): AMMONIA in the last 168 hours.  CBC: Recent Labs  Lab 11/30/19 1116  WBC 20.3*  HGB 9.1*  HCT 29.1*  MCV 85.1  PLT 97*    Cardiac Enzymes: No results for input(s): CKTOTAL, CKMB, CKMBINDEX, TROPONINI in the last 168 hours.  BNP (last 3 results) No results for input(s): BNP in the last 8760 hours.  ProBNP (last 3 results) No results for  input(s): PROBNP in the last 8760 hours.  Radiological Exams: No results found.  Assessment/Plan Active Problems:   Acute on chronic respiratory failure with hypoxia (HCC)   COVID-19 virus infection   Pneumonia due to COVID-19 virus   Chronic HFrEF (heart failure with reduced ejection fraction) (HCC)   Tracheostomy status (HCC)   1. Acute on chronic respiratory failure with hypoxia spoke with respiratory therapy during rounds will change over to high flow nasal cannula and titrate to keep saturations greater than 88%.  Also start with capping trials 2. COVID-19 virus infection in recovery we will continue with supportive care 3. Pneumonia due to COVID-19 clinically improving 4. Chronic heart failure reduced ejection fraction continue supportive care 5. Tracheostomy at baseline we will begin capping   I have personally seen and evaluated the patient, evaluated laboratory and imaging results, formulated the assessment and plan and placed orders. The Patient requires high complexity decision making with multiple systems involvement.  Rounds were done with the Respiratory Therapy Director and Staff therapists and discussed with nursing staff also.  Yevonne Pax, MD [facility] Pulmonary Critical Care Medicine Sleep Medicine

## 2019-12-05 NOTE — Consult Note (Signed)
Ref: Carron Curie, MD   Subjective:  A. Fibrillation with RVR.  Objective:  Vital Signs in the last 24 hours: BP: 140/90 P: 118 R;23 T: 96.5 degree F O2 sat: 99 % FiO2 50 % on T collar  Physical Exam: BP Readings from Last 1 Encounters:  No data found for BP     Wt Readings from Last 1 Encounters:  No data found for Wt    Weight change:  There is no height or weight on file to calculate BMI. HEENT: [place]/AT, Eyes-Brown, Conjunctiva-Pale pink, Sclera-Non-icteric Neck: No JVD, No bruit, Tracheostomy tube present. Lungs:  Clear, Bilateral. Cardiac:  Regular rhythm, normal S1 and S2, no S3. II/VI systolic murmur. Abdomen:  Soft, non-tender. BS present. Extremities:  Trace edema present. No cyanosis. No clubbing. CNS: AxOx1, Cranial nerves grossly intact.  Skin: Warm and dry.   Intake/Output from previous day: No intake/output data recorded.    Lab Results: BMET    Component Value Date/Time   NA 141 11/30/2019 1116   NA 149 (H) 11/27/2019 0515   NA 146 (H) 11/25/2019 0639   K 4.5 11/30/2019 1116   K 4.7 11/27/2019 0515   K 4.1 11/25/2019 0639   CL 104 11/30/2019 1116   CL 108 11/27/2019 0515   CL 107 11/25/2019 0639   CO2 31 11/30/2019 1116   CO2 32 11/27/2019 0515   CO2 33 (H) 11/25/2019 0639   GLUCOSE 206 (H) 11/30/2019 1116   GLUCOSE 218 (H) 11/27/2019 0515   GLUCOSE 192 (H) 11/25/2019 0639   BUN 44 (H) 11/30/2019 1116   BUN 50 (H) 11/27/2019 0515   BUN 57 (H) 11/25/2019 0639   CREATININE 0.73 11/30/2019 1116   CREATININE 0.76 11/27/2019 0515   CREATININE 0.73 11/25/2019 0639   CALCIUM 8.5 (L) 11/30/2019 1116   CALCIUM 9.1 11/27/2019 0515   CALCIUM 9.1 11/25/2019 0639   GFRNONAA >60 11/30/2019 1116   GFRNONAA >60 11/27/2019 0515   GFRNONAA >60 11/25/2019 0639   CBC    Component Value Date/Time   WBC 20.3 (H) 11/30/2019 1116   RBC 3.42 (L) 11/30/2019 1116   HGB 9.1 (L) 11/30/2019 1116   HCT 29.1 (L) 11/30/2019 1116   PLT 97 (L) 11/30/2019 1116    MCV 85.1 11/30/2019 1116   MCH 26.6 11/30/2019 1116   MCHC 31.3 11/30/2019 1116   RDW 22.4 (H) 11/30/2019 1116   HEPATIC Function Panel Recent Labs    11/15/19 1022  PROT 6.5   HEMOGLOBIN A1C No components found for: HGA1C,  MPG CARDIAC ENZYMES No results found for: CKTOTAL, CKMB, CKMBINDEX, TROPONINI BNP No results for input(s): PROBNP in the last 8760 hours. TSH Recent Labs    11/15/19 1020  TSH 2.859   CHOLESTEROL No results for input(s): CHOL in the last 8760 hours.  Scheduled Meds: Continuous Infusions: PRN Meds:.  Assessment/Plan: Acute on chronic respiratory failure with hypoxia COVID-19 pneuonia CHF, HFrEF Hypertension S/P tracheostomy Type 2 DM  Change amlodipine to Diltiazem.   LOS: 0 days   Time spent including chart review, lab review, examination, discussion with patien's nurse, tech and referring doctort : 30 min   Orpah Cobb  MD  12/05/2019, 6:21 AM

## 2019-12-06 DIAGNOSIS — J9621 Acute and chronic respiratory failure with hypoxia: Secondary | ICD-10-CM | POA: Diagnosis not present

## 2019-12-06 DIAGNOSIS — Z930 Tracheostomy status: Secondary | ICD-10-CM | POA: Diagnosis not present

## 2019-12-06 DIAGNOSIS — U071 COVID-19: Secondary | ICD-10-CM | POA: Diagnosis not present

## 2019-12-06 DIAGNOSIS — I5022 Chronic systolic (congestive) heart failure: Secondary | ICD-10-CM | POA: Diagnosis not present

## 2019-12-06 LAB — CULTURE, BLOOD (ROUTINE X 2): Culture: NO GROWTH

## 2019-12-06 NOTE — Progress Notes (Signed)
Pulmonary Critical Care Medicine [facility] [place]   PULMONARY CRITICAL CARE SERVICE  PROGRESS NOTE  Date of Service: 12/06/2019  [name]  [id number]  DOB: August 01, 1950   DOA: 11/14/2019  Referring Physician: Carron Curie, MD  HPI: Jacob Vasquez is a 69 y.o. male seen for follow up of Acute on Chronic Respiratory Failure.  Patient currently is capping on high flow which we started yesterday and is actually doing quite well  Medications: Reviewed on Rounds  Physical Exam:  Vitals: Temperature is 97.2 pulse 86 respiratory rate is 27  Ventilator Settings capping off the vent   General: Comfortable at this time  Eyes: Grossly normal lids, irises & conjunctiva  ENT: grossly tongue is normal  Neck: no obvious mass  Cardiovascular: S1 S2 normal no gallop  Respiratory: Coarse breath sounds  Abdomen: soft  Skin: no rash seen on limited exam  Musculoskeletal: not rigid  Psychiatric:unable to assess  Neurologic: no seizure no involuntary movements         Lab Data:   Basic Metabolic Panel: Recent Labs  Lab 11/30/19 1116 12/05/19 1722  NA 141 134*  K 4.5 3.4*  CL 104 101  CO2 31 25  GLUCOSE 206* 147*  BUN 44* 31*  CREATININE 0.73 0.78  CALCIUM 8.5* 8.0*    ABG: Recent Labs  Lab 12/05/19 1610  PHART 7.510*  PCO2ART 33.5  PO2ART 74.5*  HCO3 26.5  O2SAT 96.2    Liver Function Tests: No results for input(s): AST, ALT, ALKPHOS, BILITOT, PROT, ALBUMIN in the last 168 hours. No results for input(s): LIPASE, AMYLASE in the last 168 hours. No results for input(s): AMMONIA in the last 168 hours.  CBC: Recent Labs  Lab 11/30/19 1116 12/05/19 1722  WBC 20.3* 9.0  HGB 9.1* 10.3*  HCT 29.1* 32.2*  MCV 85.1 83.9  PLT 97* 105*    Cardiac Enzymes: No results for input(s): CKTOTAL, CKMB, CKMBINDEX, TROPONINI in the last 168 hours.  BNP (last 3 results) Recent Labs    12/05/19 1722  BNP 1,504.9*    ProBNP (last 3 results) No  results for input(s): PROBNP in the last 8760 hours.  Radiological Exams: DG CHEST PORT 1 VIEW  Result Date: 12/05/2019 CLINICAL DATA:  Hypoxia. EXAM: PORTABLE CHEST 1 VIEW COMPARISON:  11/30/2019. FINDINGS: NG tube. Tracheostomy tube in stable position. Stable cardiomegaly. Diffuse interstitial prominence is again noted. Interstitial process such as pneumonitis and or interstitial edema could present this fashion. No pleural effusion. No pneumothorax. IMPRESSION: 1. Tracheostomy tube in stable position. 2. Stable cardiomegaly. 3. Diffuse interstitial prominence again noted. Electronically Signed   By: Maisie Fus  Register   On: 12/05/2019 12:56    Assessment/Plan Active Problems:   Acute on chronic respiratory failure with hypoxia (HCC)   COVID-19 virus infection   Pneumonia due to COVID-19 virus   Chronic HFrEF (heart failure with reduced ejection fraction) (HCC)   Tracheostomy status (HCC)   1. Acute on chronic respiratory failure with hypoxia we will continue with capping trials. 2. COVID-19 virus infection in recovery continue with supportive care 3. Pneumonia due to COVID-19 slow improvement still diffuse interstitial infiltrates 4. Chronic heart failure reduced ejection fraction patient is at baseline we will continue to monitor 5. Tracheostomy doing fine with capping   I have personally seen and evaluated the patient, evaluated laboratory and imaging results, formulated the assessment and plan and placed orders. The Patient requires high complexity decision making with multiple systems involvement.  Rounds were done with the  Respiratory Therapy Director and Staff therapists and discussed with nursing staff also.  Allyne Gee, MD [facility] Pulmonary Critical Care Medicine Sleep Medicine

## 2019-12-07 ENCOUNTER — Other Ambulatory Visit (HOSPITAL_COMMUNITY): Payer: Medicare Other

## 2019-12-07 DIAGNOSIS — J9621 Acute and chronic respiratory failure with hypoxia: Secondary | ICD-10-CM | POA: Diagnosis not present

## 2019-12-07 DIAGNOSIS — I5022 Chronic systolic (congestive) heart failure: Secondary | ICD-10-CM | POA: Diagnosis not present

## 2019-12-07 DIAGNOSIS — U071 COVID-19: Secondary | ICD-10-CM | POA: Diagnosis not present

## 2019-12-07 DIAGNOSIS — Z930 Tracheostomy status: Secondary | ICD-10-CM | POA: Diagnosis not present

## 2019-12-07 LAB — POTASSIUM: Potassium: 3.8 mmol/L (ref 3.5–5.1)

## 2019-12-07 MED ORDER — IOHEXOL 300 MG/ML  SOLN
100.0000 mL | Freq: Once | INTRAMUSCULAR | Status: AC | PRN
  Administered 2019-12-07: 100 mL via INTRAVENOUS

## 2019-12-07 NOTE — Progress Notes (Signed)
Pulmonary Critical Care Medicine [facility] [place]   PULMONARY CRITICAL CARE SERVICE  PROGRESS NOTE  Date of Service: 12/07/2019  Jacob Vasquez  ELF:[id number]  DOB: [birth year]   DOA: 11/14/2019  Referring Physician: Carron Curie, MD  HPI: Jacob Vasquez is a 69 y.o. male seen for follow up of Acute on Chronic Respiratory Failure.  Patient currently is capping is also on heated high flow at 21.  Medications: Reviewed on Rounds  Physical Exam:  Vitals: Temperature is 96.7 pulse 101 respiratory 24 blood pressure is 105/53 saturations 95%  Ventilator Settings capping on heated high flow  . General: Comfortable at this time . Eyes: Grossly normal lids, irises & conjunctiva . ENT: grossly tongue is normal . Neck: no obvious mass . Cardiovascular: S1 S2 normal no gallop . Respiratory: Scattered rhonchi very coarse breath sounds . Abdomen: soft . Skin: no rash seen on limited exam . Musculoskeletal: not rigid . Psychiatric:unable to assess . Neurologic: no seizure no involuntary movements         Lab Data:   Basic Metabolic Panel: Recent Labs  Lab 12/05/19 1722  NA 134*  K 3.4*  CL 101  CO2 25  GLUCOSE 147*  BUN 31*  CREATININE 0.78  CALCIUM 8.0*    ABG: Recent Labs  Lab 12/05/19 1610  PHART 7.510*  PCO2ART 33.5  PO2ART 74.5*  HCO3 26.5  O2SAT 96.2    Liver Function Tests: No results for input(s): AST, ALT, ALKPHOS, BILITOT, PROT, ALBUMIN in the last 168 hours. No results for input(s): LIPASE, AMYLASE in the last 168 hours. No results for input(s): AMMONIA in the last 168 hours.  CBC: Recent Labs  Lab 12/05/19 1722  WBC 9.0  HGB 10.3*  HCT 32.2*  MCV 83.9  PLT 105*    Cardiac Enzymes: No results for input(s): CKTOTAL, CKMB, CKMBINDEX, TROPONINI in the last 168 hours.  BNP (last 3 results) Recent Labs    12/05/19 1722  BNP 1,504.9*    ProBNP (last 3 results) No results for input(s): PROBNP in the last 8760  hours.  Radiological Exams: CT ABDOMEN PELVIS W CONTRAST  Result Date: 12/07/2019 CLINICAL DATA:  Abdominal pain.  History of COVID-19 pneumonia. EXAM: CT ABDOMEN AND PELVIS WITH CONTRAST TECHNIQUE: Multidetector CT imaging of the abdomen and pelvis was performed using the standard protocol following bolus administration of intravenous contrast. CONTRAST:  OMNIPAQUE IOHEXOL 300 MG/ML  SOLN COMPARISON:  None. FINDINGS: Lower chest: Bronchiectasis with interstitial and mild patchy peripheral airspace opacities are noted at the lung bases. Cyst in the right middle lobe. Heart mildly enlarged. Small posterior pericardial effusion. No pleural effusion. Hepatobiliary: No focal liver abnormality is seen. No gallstones, gallbladder wall thickening, or biliary dilatation. Pancreas: Small elongated or tubular type cystic lesion arises from the pancreatic body, which may reflect a dilated side duct, 1.2 cm in long axis 4 mm transversely. No other pancreatic mass or lesion. No inflammation. Spleen: Normal in size without focal abnormality. Adrenals/Urinary Tract: No adrenal masses. Kidneys normal in size, orientation and position with symmetric enhancement and excretion. 1.3 cm cyst, upper pole of the left kidney. Subcentimeter low-density right renal lesions are also noted, too small to fully characterize but consistent with cysts. No stones. No hydronephrosis. Normal ureters. Bladder decompressed but otherwise unremarkable. Stomach/Bowel: Stomach are unremarkable. Small bowel and colon are normal in caliber. No wall thickening. No inflammation. Normal appendix visualized. Mild increased colonic stool and moderate increased rectal stool. Vascular/Lymphatic: Mild aortic atherosclerosis. No significant stenosis.  No enlarged lymph nodes. Reproductive: Unremarkable. Other: Trace amount of ascites most evident along the anterior superior aspect of the liver. Musculoskeletal: No fracture or acute finding. No osteoblastic  or osteolytic lesions. IMPRESSION: 1. There are lung base findings including bronchiectasis and interstitial thickening with peripheral patchy consolidation. This may all be chronic. Superimposed infection is not excluded. 2. Trace amount of ascites of unclear etiology. No acute findings within the abdomen or pelvis. 3. Mild increased colonic stool with moderate increased stool in the rectum. 4. Small pancreatic cystic lesion, 1.2 cm in long axis. Follow-up CT in 2 years to assess for stability. This is based on the consensus guidelines for Management of incidental pancreatic cysts but on CT. 5. Aortic atherosclerosis. Electronically Signed   By: Amie Portland M.D.   On: 12/07/2019 07:12   DG Abd Portable 1V  Result Date: 12/07/2019 CLINICAL DATA:  Abdominal pain. EXAM: PORTABLE ABDOMEN - 1 VIEW COMPARISON:  November 14, 2019 FINDINGS: Numerous air-filled bowel loops are seen throughout the abdomen and pelvis with suspected small bowel dilatation noted along the midline of the lower abdomen. No radio-opaque calculi or other significant radiographic abnormality are seen. IMPRESSION: Findings suggestive of a partial small bowel obstruction versus ileus. Further correlation with abdomen pelvis CT is recommended. Electronically Signed   By: Aram Candela M.D.   On: 12/07/2019 02:01    Assessment/Plan Active Problems:   Acute on chronic respiratory failure with hypoxia (HCC)   COVID-19 virus infection   Pneumonia due to COVID-19 virus   Chronic HFrEF (heart failure with reduced ejection fraction) (HCC)   Tracheostomy status (HCC)   1. Acute on chronic respiratory failure with hypoxia we will continue with capping trials can switch the oxygen over to Oxymizer 2. COVID-19 virus infection in recovery we will continue all 3. Pneumonia due to COVID-19 treated. 4. Chronic heart failure reduced ejection fraction at baseline 5. Tracheostomy remains in place   I have personally seen and evaluated the  patient, evaluated laboratory and imaging results, formulated the assessment and plan and placed orders. The Patient requires high complexity decision making with multiple systems involvement.  Rounds were done with the Respiratory Therapy Director and Staff therapists and discussed with nursing staff also.  Yevonne Pax, MD [facility] Pulmonary Critical Care Medicine Sleep Medicine

## 2019-12-08 DIAGNOSIS — I5022 Chronic systolic (congestive) heart failure: Secondary | ICD-10-CM | POA: Diagnosis not present

## 2019-12-08 DIAGNOSIS — Z930 Tracheostomy status: Secondary | ICD-10-CM | POA: Diagnosis not present

## 2019-12-08 DIAGNOSIS — J9621 Acute and chronic respiratory failure with hypoxia: Secondary | ICD-10-CM | POA: Diagnosis not present

## 2019-12-08 DIAGNOSIS — U071 COVID-19: Secondary | ICD-10-CM | POA: Diagnosis not present

## 2019-12-08 NOTE — Progress Notes (Signed)
Pulmonary Critical Care Medicine [facility] [place]   PULMONARY CRITICAL CARE SERVICE  PROGRESS NOTE  Date of Service: 12/08/2019  Jacob Vasquez  [id number]:[id number]  DOB: [birth year]   DOA: 11/14/2019  Referring Physician: Carron Curie, MD  HPI: Jacob Vasquez is a 69 y.o. male seen for follow up of Acute on Chronic Respiratory Failure. Patient is capping on high flow oxygen.  Today we will try to downsize her to a number for trach and see if patient will able to tolerate nasal cannula  Medications: Reviewed on Rounds  Physical Exam:  Vitals: Temperature is 96.4 pulse 96 respiratory rate 12 blood pressure is 137/81 saturations 93%  Ventilator Settings capping of the vent  . General: Comfortable at this time . Eyes: Grossly normal lids, irises & conjunctiva . ENT: grossly tongue is normal . Neck: no obvious mass . Cardiovascular: S1 S2 normal no gallop . Respiratory: No rhonchi no rales are noted at this time. . Abdomen: soft . Skin: no rash seen on limited exam . Musculoskeletal: not rigid . Psychiatric:unable to assess . Neurologic: no seizure no involuntary movements         Lab Data:   Basic Metabolic Panel: Recent Labs  Lab 12/05/19 1722 12/07/19 1324  NA 134*  --   K 3.4* 3.8  CL 101  --   CO2 25  --   GLUCOSE 147*  --   BUN 31*  --   CREATININE 0.78  --   CALCIUM 8.0*  --     ABG: Recent Labs  Lab 12/05/19 1610  PHART 7.510*  PCO2ART 33.5  PO2ART 74.5*  HCO3 26.5  O2SAT 96.2    Liver Function Tests: No results for input(s): AST, ALT, ALKPHOS, BILITOT, PROT, ALBUMIN in the last 168 hours. No results for input(s): LIPASE, AMYLASE in the last 168 hours. No results for input(s): AMMONIA in the last 168 hours.  CBC: Recent Labs  Lab 12/05/19 1722  WBC 9.0  HGB 10.3*  HCT 32.2*  MCV 83.9  PLT 105*    Cardiac Enzymes: No results for input(s): CKTOTAL, CKMB, CKMBINDEX, TROPONINI in the last 168 hours.  BNP (last 3 results) Recent  Labs    12/05/19 1722  BNP 1,504.9*    ProBNP (last 3 results) No results for input(s): PROBNP in the last 8760 hours.  Radiological Exams: CT ABDOMEN PELVIS W CONTRAST  Result Date: 12/07/2019 CLINICAL DATA:  Abdominal pain.  History of COVID-19 pneumonia. EXAM: CT ABDOMEN AND PELVIS WITH CONTRAST TECHNIQUE: Multidetector CT imaging of the abdomen and pelvis was performed using the standard protocol following bolus administration of intravenous contrast. CONTRAST:  OMNIPAQUE IOHEXOL 300 MG/ML  SOLN COMPARISON:  None. FINDINGS: Lower chest: Bronchiectasis with interstitial and mild patchy peripheral airspace opacities are noted at the lung bases. Cyst in the right middle lobe. Heart mildly enlarged. Small posterior pericardial effusion. No pleural effusion. Hepatobiliary: No focal liver abnormality is seen. No gallstones, gallbladder wall thickening, or biliary dilatation. Pancreas: Small elongated or tubular type cystic lesion arises from the pancreatic body, which may reflect a dilated side duct, 1.2 cm in long axis 4 mm transversely. No other pancreatic mass or lesion. No inflammation. Spleen: Normal in size without focal abnormality. Adrenals/Urinary Tract: No adrenal masses. Kidneys normal in size, orientation and position with symmetric enhancement and excretion. 1.3 cm cyst, upper pole of the left kidney. Subcentimeter low-density right renal lesions are also noted, too small to fully characterize but consistent with cysts. No stones. No  hydronephrosis. Normal ureters. Bladder decompressed but otherwise unremarkable. Stomach/Bowel: Stomach are unremarkable. Small bowel and colon are normal in caliber. No wall thickening. No inflammation. Normal appendix visualized. Mild increased colonic stool and moderate increased rectal stool. Vascular/Lymphatic: Mild aortic atherosclerosis. No significant stenosis. No enlarged lymph nodes. Reproductive: Unremarkable. Other: Trace amount of ascites most  evident along the anterior superior aspect of the liver. Musculoskeletal: No fracture or acute finding. No osteoblastic or osteolytic lesions. IMPRESSION: 1. There are lung base findings including bronchiectasis and interstitial thickening with peripheral patchy consolidation. This may all be chronic. Superimposed infection is not excluded. 2. Trace amount of ascites of unclear etiology. No acute findings within the abdomen or pelvis. 3. Mild increased colonic stool with moderate increased stool in the rectum. 4. Small pancreatic cystic lesion, 1.2 cm in long axis. Follow-up CT in 2 years to assess for stability. This is based on the consensus guidelines for Management of incidental pancreatic cysts but on CT. 5. Aortic atherosclerosis. Electronically Signed   By: Amie Portland M.D.   On: 12/07/2019 07:12   DG Abd Portable 1V  Result Date: 12/07/2019 CLINICAL DATA:  Abdominal pain. EXAM: PORTABLE ABDOMEN - 1 VIEW COMPARISON:  November 14, 2019 FINDINGS: Numerous air-filled bowel loops are seen throughout the abdomen and pelvis with suspected small bowel dilatation noted along the midline of the lower abdomen. No radio-opaque calculi or other significant radiographic abnormality are seen. IMPRESSION: Findings suggestive of a partial small bowel obstruction versus ileus. Further correlation with abdomen pelvis CT is recommended. Electronically Signed   By: Aram Candela M.D.   On: 12/07/2019 02:01    Assessment/Plan Active Problems:   Acute on chronic respiratory failure with hypoxia (HCC)   COVID-19 virus infection   Pneumonia due to COVID-19 virus   Chronic HFrEF (heart failure with reduced ejection fraction) (HCC)   Tracheostomy status (HCC)   1. Acute on chronic respiratory failure hypoxia we will continue with capping trials as ordered change the trach over to a #4 trach 2. COVID-19 virus infection in recovery we will continue to follow 3. Pneumonia due to COVID-19 at baseline we will  continue supportive care 4. Chronic heart failure reduced ejection fraction at baseline 5. Tracheostomy no change we will continue supportive care   I have personally seen and evaluated the patient, evaluated laboratory and imaging results, formulated the assessment and plan and placed orders. The Patient requires high complexity decision making with multiple systems involvement.  Rounds were done with the Respiratory Therapy Director and Staff therapists and discussed with nursing staff also.  Yevonne Pax, MD [facility] Pulmonary Critical Care Medicine Sleep Medicine

## 2019-12-09 DIAGNOSIS — U071 COVID-19: Secondary | ICD-10-CM | POA: Diagnosis not present

## 2019-12-09 DIAGNOSIS — J9621 Acute and chronic respiratory failure with hypoxia: Secondary | ICD-10-CM | POA: Diagnosis not present

## 2019-12-09 DIAGNOSIS — Z930 Tracheostomy status: Secondary | ICD-10-CM | POA: Diagnosis not present

## 2019-12-09 DIAGNOSIS — I5022 Chronic systolic (congestive) heart failure: Secondary | ICD-10-CM | POA: Diagnosis not present

## 2019-12-09 NOTE — Progress Notes (Signed)
Pulmonary Critical Care Medicine [facility] [place]   PULMONARY CRITICAL CARE SERVICE  PROGRESS NOTE  Date of Service: 12/09/2019  Jacob Vasquez  [id number]  DOB: 27-Mar-1950   DOA: 11/14/2019  Referring Physician: Carron Curie, MD  HPI: Jacob Vasquez is a 69 y.o. male seen for follow up of Acute on Chronic Respiratory Failure.  Patient is capping currently on 3 L of oxygen  Medications: Reviewed on Rounds  Physical Exam:  Vitals: Temperature is 96.1 pulse 96 respiratory rate 23 blood pressure is 101/72 saturations 95%  Ventilator Settings on capping trials  . General: Comfortable at this time . Eyes: Grossly normal lids, irises & conjunctiva . ENT: grossly tongue is normal . Neck: no obvious mass . Cardiovascular: S1 S2 normal no gallop . Respiratory: No rhonchi no rales noted at this time . Abdomen: soft . Skin: no rash seen on limited exam . Musculoskeletal: not rigid . Psychiatric:unable to assess . Neurologic: no seizure no involuntary movements         Lab Data:   Basic Metabolic Panel: Recent Labs  Lab 12/05/19 1722 12/07/19 1324  NA 134*  --   K 3.4* 3.8  CL 101  --   CO2 25  --   GLUCOSE 147*  --   BUN 31*  --   CREATININE 0.78  --   CALCIUM 8.0*  --     ABG: Recent Labs  Lab 12/05/19 1610  PHART 7.510*  PCO2ART 33.5  PO2ART 74.5*  HCO3 26.5  O2SAT 96.2    Liver Function Tests: No results for input(s): AST, ALT, ALKPHOS, BILITOT, PROT, ALBUMIN in the last 168 hours. No results for input(s): LIPASE, AMYLASE in the last 168 hours. No results for input(s): AMMONIA in the last 168 hours.  CBC: Recent Labs  Lab 12/05/19 1722  WBC 9.0  HGB 10.3*  HCT 32.2*  MCV 83.9  PLT 105*    Cardiac Enzymes: No results for input(s): CKTOTAL, CKMB, CKMBINDEX, TROPONINI in the last 168 hours.  BNP (last 3 results) Recent Labs    12/05/19 1722  BNP 1,504.9*    ProBNP (last 3 results) No results for input(s): PROBNP in the last  8760 hours.  Radiological Exams: No results found.  Assessment/Plan Active Problems:   Acute on chronic respiratory failure with hypoxia (HCC)   COVID-19 virus infection   Pneumonia due to COVID-19 virus   Chronic HFrEF (heart failure with reduced ejection fraction) (HCC)   Tracheostomy status (HCC)   1. Acute on chronic respiratory failure hypoxia we will continue with capping trials titrate oxygen as tolerated right now is requiring 3 L 2. COVID-19 virus infection in recovery we will continue with supportive care 3. Pneumonia due to COVID-19 treated we will continue to monitor 4. Chronic heart failure reduced ejection fraction appears to be compensated 5. Tracheostomy doing well with capping   I have personally seen and evaluated the patient, evaluated laboratory and imaging results, formulated the assessment and plan and placed orders. The Patient requires high complexity decision making with multiple systems involvement.  Rounds were done with the Respiratory Therapy Director and Staff therapists and discussed with nursing staff also.  Yevonne Pax, MD [facility] Pulmonary Critical Care Medicine Sleep Medicine

## 2019-12-10 DIAGNOSIS — J9621 Acute and chronic respiratory failure with hypoxia: Secondary | ICD-10-CM | POA: Diagnosis not present

## 2019-12-10 DIAGNOSIS — I5022 Chronic systolic (congestive) heart failure: Secondary | ICD-10-CM | POA: Diagnosis not present

## 2019-12-10 DIAGNOSIS — Z930 Tracheostomy status: Secondary | ICD-10-CM | POA: Diagnosis not present

## 2019-12-10 DIAGNOSIS — U071 COVID-19: Secondary | ICD-10-CM | POA: Diagnosis not present

## 2019-12-10 NOTE — Progress Notes (Signed)
Pulmonary Critical Care Medicine [facility] [place]   PULMONARY CRITICAL CARE SERVICE  PROGRESS NOTE  Date of Service: 12/10/2019  Jacob Vasquez  [id number]  DOB: 1950-03-05   DOA: 11/14/2019  Referring Physician: Carron Curie, MD  HPI: Jacob Vasquez is a 69 y.o. male seen for follow up of Acute on Chronic Respiratory Failure.  Patient at this time is capping appears to be doing well.  Try to wean FiO2 down  Medications: Reviewed on Rounds  Physical Exam:  Vitals: Temperature is 96.4 pulse 90 respiratory rate 30 blood pressure is 125/86 saturations 100%  Ventilator Settings capping off the ventilator right now  . General: Comfortable at this time . Eyes: Grossly normal lids, irises & conjunctiva . ENT: grossly tongue is normal . Neck: no obvious mass . Cardiovascular: S1 S2 normal no gallop . Respiratory: Scattered rhonchi very coarse breath sounds . Abdomen: soft . Skin: no rash seen on limited exam . Musculoskeletal: not rigid . Psychiatric:unable to assess . Neurologic: no seizure no involuntary movements         Lab Data:   Basic Metabolic Panel: Recent Labs  Lab 12/05/19 1722 12/07/19 1324  NA 134*  --   K 3.4* 3.8  CL 101  --   CO2 25  --   GLUCOSE 147*  --   BUN 31*  --   CREATININE 0.78  --   CALCIUM 8.0*  --     ABG: Recent Labs  Lab 12/05/19 1610  PHART 7.510*  PCO2ART 33.5  PO2ART 74.5*  HCO3 26.5  O2SAT 96.2    Liver Function Tests: No results for input(s): AST, ALT, ALKPHOS, BILITOT, PROT, ALBUMIN in the last 168 hours. No results for input(s): LIPASE, AMYLASE in the last 168 hours. No results for input(s): AMMONIA in the last 168 hours.  CBC: Recent Labs  Lab 12/05/19 1722  WBC 9.0  HGB 10.3*  HCT 32.2*  MCV 83.9  PLT 105*    Cardiac Enzymes: No results for input(s): CKTOTAL, CKMB, CKMBINDEX, TROPONINI in the last 168 hours.  BNP (last 3 results) Recent Labs    12/05/19 1722  BNP 1,504.9*    ProBNP  (last 3 results) No results for input(s): PROBNP in the last 8760 hours.  Radiological Exams: No results found.  Assessment/Plan Active Problems:   Acute on chronic respiratory failure with hypoxia (HCC)   COVID-19 virus infection   Pneumonia due to COVID-19 virus   Chronic HFrEF (heart failure with reduced ejection fraction) (HCC)   Tracheostomy status (HCC)   1. Acute on chronic respiratory failure with hypoxia we will continue with capping trials titrate oxygen continue pulmonary toilet. 2. COVID-19 virus infection in recovery we will continue to follow 3. Pneumonia due to COVID-19 has been treated slow improvement 4. Chronic heart failure reduced ejection fraction appears to be compensated 5. Tracheostomy patient is doing well with capping working towards eventual decannulation   I have personally seen and evaluated the patient, evaluated laboratory and imaging results, formulated the assessment and plan and placed orders. The Patient requires high complexity decision making with multiple systems involvement.  Rounds were done with the Respiratory Therapy Director and Staff therapists and discussed with nursing staff also.  Yevonne Pax, MD [facility] Pulmonary Critical Care Medicine Sleep Medicine

## 2019-12-12 DIAGNOSIS — I5022 Chronic systolic (congestive) heart failure: Secondary | ICD-10-CM | POA: Diagnosis not present

## 2019-12-12 DIAGNOSIS — Z930 Tracheostomy status: Secondary | ICD-10-CM | POA: Diagnosis not present

## 2019-12-12 DIAGNOSIS — U071 COVID-19: Secondary | ICD-10-CM | POA: Diagnosis not present

## 2019-12-12 DIAGNOSIS — J9621 Acute and chronic respiratory failure with hypoxia: Secondary | ICD-10-CM | POA: Diagnosis not present

## 2019-12-12 NOTE — Progress Notes (Signed)
Pulmonary Critical Care Medicine [facility] [place]   PULMONARY CRITICAL CARE SERVICE  PROGRESS NOTE  Date of Service: 12/12/2019  Neymar Dowe  [id number]  DOB: 03/09/1950   DOA: 11/14/2019  Referring Physician: Carron Curie, MD  HPI: Eriel Doyon is a 69 y.o. male seen for follow up of Acute on Chronic Respiratory Failure.  Is to do okay with capping trials respiratory therapy wanted to hold off on decannulation for another couple of days  Medications: Reviewed on Rounds  Physical Exam:  Vitals: Temperature is 96.0 pulse 105 respiratory 20 blood pressure is 118/67 saturations 98%  Ventilator Settings capping off the ventilator  . General: Comfortable at this time . Eyes: Grossly normal lids, irises & conjunctiva . ENT: grossly tongue is normal . Neck: no obvious mass . Cardiovascular: S1 S2 normal no gallop . Respiratory: No rhonchi no rales noted at this time . Abdomen: soft . Skin: no rash seen on limited exam . Musculoskeletal: not rigid . Psychiatric:unable to assess . Neurologic: no seizure no involuntary movements         Lab Data:   Basic Metabolic Panel: Recent Labs  Lab 12/05/19 1722 12/07/19 1324  NA 134*  --   K 3.4* 3.8  CL 101  --   CO2 25  --   GLUCOSE 147*  --   BUN 31*  --   CREATININE 0.78  --   CALCIUM 8.0*  --     ABG: Recent Labs  Lab 12/05/19 1610  PHART 7.510*  PCO2ART 33.5  PO2ART 74.5*  HCO3 26.5  O2SAT 96.2    Liver Function Tests: No results for input(s): AST, ALT, ALKPHOS, BILITOT, PROT, ALBUMIN in the last 168 hours. No results for input(s): LIPASE, AMYLASE in the last 168 hours. No results for input(s): AMMONIA in the last 168 hours.  CBC: Recent Labs  Lab 12/05/19 1722  WBC 9.0  HGB 10.3*  HCT 32.2*  MCV 83.9  PLT 105*    Cardiac Enzymes: No results for input(s): CKTOTAL, CKMB, CKMBINDEX, TROPONINI in the last 168 hours.  BNP (last 3 results) Recent Labs    12/05/19 1722  BNP 1,504.9*     ProBNP (last 3 results) No results for input(s): PROBNP in the last 8760 hours.  Radiological Exams: No results found.  Assessment/Plan Active Problems:   Acute on chronic respiratory failure with hypoxia (HCC)   COVID-19 virus infection   Pneumonia due to COVID-19 virus   Chronic HFrEF (heart failure with reduced ejection fraction) (HCC)   Tracheostomy status (HCC)   1. Acute on chronic respiratory failure with hypoxia we will continue with supportive care on capping right now.  Holding off per request of respiratory therapy 2. COVID-19 virus infection with Covid 3. Pneumonia due to COVID-19 treated in resolution 4. Chronic heart failure reduced ejection fraction cardiology following 5. Tracheostomy we will continue with capping for now   I have personally seen and evaluated the patient, evaluated laboratory and imaging results, formulated the assessment and plan and placed orders. The Patient requires high complexity decision making with multiple systems involvement.  Rounds were done with the Respiratory Therapy Director and Staff therapists and discussed with nursing staff also.  Yevonne Pax, MD [facility] Pulmonary Critical Care Medicine Sleep Medicine

## 2019-12-13 ENCOUNTER — Other Ambulatory Visit (HOSPITAL_COMMUNITY): Payer: Medicare Other

## 2019-12-13 DIAGNOSIS — U071 COVID-19: Secondary | ICD-10-CM | POA: Diagnosis not present

## 2019-12-13 DIAGNOSIS — J9621 Acute and chronic respiratory failure with hypoxia: Secondary | ICD-10-CM | POA: Diagnosis not present

## 2019-12-13 DIAGNOSIS — Z930 Tracheostomy status: Secondary | ICD-10-CM | POA: Diagnosis not present

## 2019-12-13 DIAGNOSIS — I5022 Chronic systolic (congestive) heart failure: Secondary | ICD-10-CM | POA: Diagnosis not present

## 2019-12-13 LAB — CBC
HCT: 35.4 % — ABNORMAL LOW (ref 39.0–52.0)
Hemoglobin: 10.9 g/dL — ABNORMAL LOW (ref 13.0–17.0)
MCH: 27.9 pg (ref 26.0–34.0)
MCHC: 30.8 g/dL (ref 30.0–36.0)
MCV: 90.5 fL (ref 80.0–100.0)
Platelets: 114 10*3/uL — ABNORMAL LOW (ref 150–400)
RBC: 3.91 MIL/uL — ABNORMAL LOW (ref 4.22–5.81)
RDW: 25.1 % — ABNORMAL HIGH (ref 11.5–15.5)
WBC: 4.8 10*3/uL (ref 4.0–10.5)
nRBC: 0.4 % — ABNORMAL HIGH (ref 0.0–0.2)

## 2019-12-13 LAB — BASIC METABOLIC PANEL
Anion gap: 9 (ref 5–15)
BUN: 46 mg/dL — ABNORMAL HIGH (ref 8–23)
CO2: 25 mmol/L (ref 22–32)
Calcium: 8.3 mg/dL — ABNORMAL LOW (ref 8.9–10.3)
Chloride: 104 mmol/L (ref 98–111)
Creatinine, Ser: 1 mg/dL (ref 0.61–1.24)
GFR, Estimated: >60 mL/min (ref >60)
Glucose, Bld: 142 mg/dL — ABNORMAL HIGH (ref 70–99)
Potassium: 5.1 mmol/L (ref 3.5–5.1)
Sodium: 138 mmol/L (ref 135–145)

## 2019-12-13 LAB — BRAIN NATRIURETIC PEPTIDE: B Natriuretic Peptide: 744.8 pg/mL — ABNORMAL HIGH (ref 0.0–100.0)

## 2019-12-13 NOTE — Progress Notes (Signed)
Pulmonary Critical Care Medicine [facility] [place]   PULMONARY CRITICAL CARE SERVICE  PROGRESS NOTE  Date of Service: 12/13/2019  Jacob Vasquez  UMP:[id number]  DOB: 12-21-1950   DOA: 11/14/2019  Referring Physician: Carron Curie, MD  HPI: Jacob Vasquez is a 69 y.o. male seen for follow up of Acute on Chronic Respiratory Failure. Patient continues to be doing well with capping has been capping him for well over a week numbers are looking good no desaturations  Medications: Reviewed on Rounds  Physical Exam:  Vitals: Temperature is 96.1 pulse 113 respiratory 28 blood pressure is 94/75 saturations 97%  Ventilator Settings capping off the ventilator  . General: Comfortable at this time . Eyes: Grossly normal lids, irises & conjunctiva . ENT: grossly tongue is normal . Neck: no obvious mass . Cardiovascular: S1 S2 normal no gallop . Respiratory: Scattered rhonchi no rales . Abdomen: soft . Skin: no rash seen on limited exam . Musculoskeletal: not rigid . Psychiatric:unable to assess . Neurologic: no seizure no involuntary movements         Lab Data:   Basic Metabolic Panel: Recent Labs  Lab 12/07/19 1324 12/13/19 0244  NA  --  138  K 3.8 5.1  CL  --  104  CO2  --  25  GLUCOSE  --  142*  BUN  --  46*  CREATININE  --  1.00  CALCIUM  --  8.3*    ABG: No results for input(s): PHART, PCO2ART, PO2ART, HCO3, O2SAT in the last 168 hours.  Liver Function Tests: No results for input(s): AST, ALT, ALKPHOS, BILITOT, PROT, ALBUMIN in the last 168 hours. No results for input(s): LIPASE, AMYLASE in the last 168 hours. No results for input(s): AMMONIA in the last 168 hours.  CBC: Recent Labs  Lab 12/13/19 0244  WBC 4.8  HGB 10.9*  HCT 35.4*  MCV 90.5  PLT 114*    Cardiac Enzymes: No results for input(s): CKTOTAL, CKMB, CKMBINDEX, TROPONINI in the last 168 hours.  BNP (last 3 results) Recent Labs    12/05/19 1722 12/13/19 0244  BNP 1,504.9* 744.8*     ProBNP (last 3 results) No results for input(s): PROBNP in the last 8760 hours.  Radiological Exams: DG CHEST PORT 1 VIEW  Result Date: 12/13/2019 CLINICAL DATA:  CHF EXAM: PORTABLE CHEST 1 VIEW COMPARISON:  12/05/2019 FINDINGS: Cardiac and vascular pedicle enlargement with diffuse interstitial coarsening/opacity. There is a tracheostomy tube in place. No effusion or pneumothorax. IMPRESSION: Stable cardiomegaly and interstitial opacity with fibrotic features by interval abdominal CT. Electronically Signed   By: Marnee Spring M.D.   On: 12/13/2019 05:19    Assessment/Plan Active Problems:   Acute on chronic respiratory failure with hypoxia (HCC)   COVID-19 virus infection   Pneumonia due to COVID-19 virus   Chronic HFrEF (heart failure with reduced ejection fraction) (HCC)   Tracheostomy status (HCC)   1. Acute on chronic respiratory failure with hypoxia patient is doing well with capping ready for decannulation. 2. COVID-19 virus infection resolution phase we will continue to monitor 3. Pneumonia due to COVID-19 treated slow improvement 4. Chronic heart failure reduced ejection fraction at baseline 5. Tracheostomy will be removed today   I have personally seen and evaluated the patient, evaluated laboratory and imaging results, formulated the assessment and plan and placed orders. The Patient requires high complexity decision making with multiple systems involvement.  Rounds were done with the Respiratory Therapy Director and Staff therapists and discussed with nursing  staff also.  Allyne Gee, MD [facility] Pulmonary Critical Care Medicine Sleep Medicine

## 2019-12-16 LAB — NOVEL CORONAVIRUS, NAA (HOSP ORDER, SEND-OUT TO REF LAB; TAT 18-24 HRS): SARS-CoV-2, NAA: NOT DETECTED

## 2019-12-17 ENCOUNTER — Other Ambulatory Visit (HOSPITAL_COMMUNITY): Payer: Medicare Other

## 2020-01-08 DEATH — deceased

## 2021-01-15 IMAGING — CT CT ABD-PELV W/ CM
2 of 5 series · 15 of 46 positions shown, 17 images · IV contrast (Omni 300)
Comparison: None.

CLINICAL DATA: Abdominal pain.  History of [id number] pneumonia.

EXAM:
CT ABDOMEN AND PELVIS WITH CONTRAST
TECHNIQUE: Multidetector CT imaging of the abdomen and pelvis was performed
using the standard protocol following bolus administration of
intravenous contrast.
CONTRAST:  100mL OMNIPAQUE IOHEXOL 300 MG/ML  SOLN

[Series 3: a/p w/ 5mm · axial · 0.88mm/px · z∈[+989,+1494]mm · 12 of 115 slices shown, 14 images]
[im 7/115  soft-tissue]
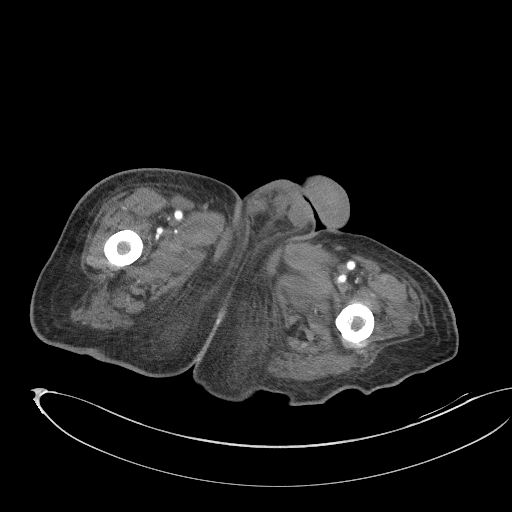
[im 7/115  bone]
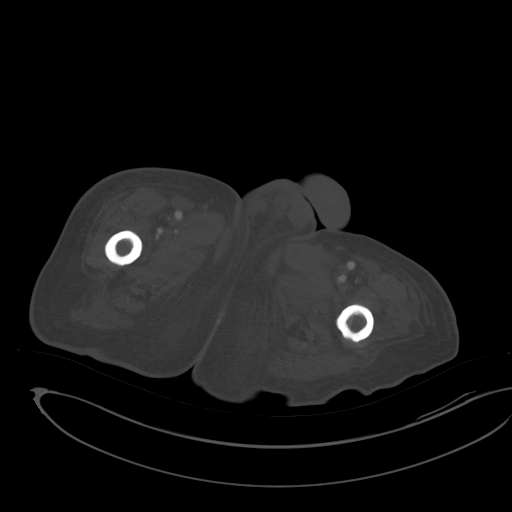
[im 20/115  soft-tissue]
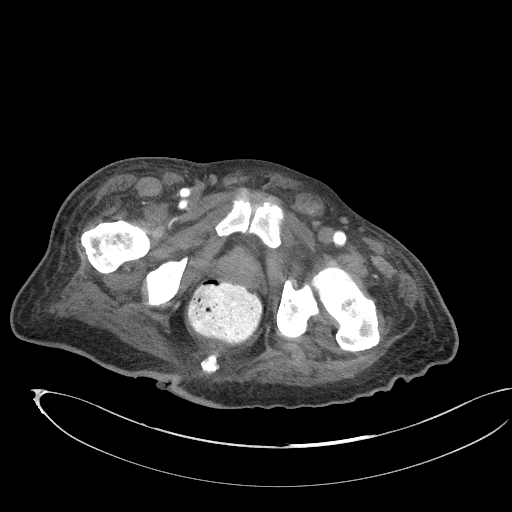
[im 26/115  soft-tissue]
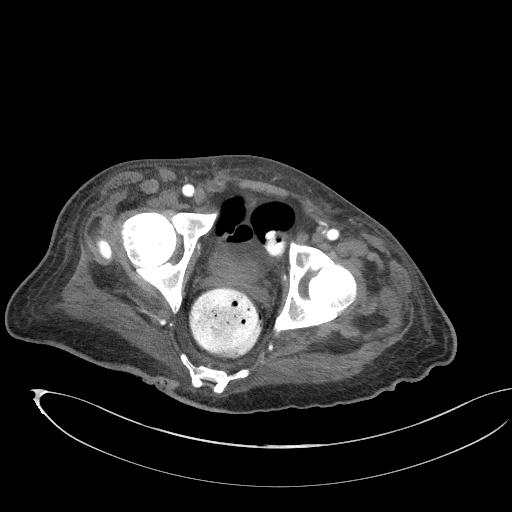
[im 32/115  soft-tissue]
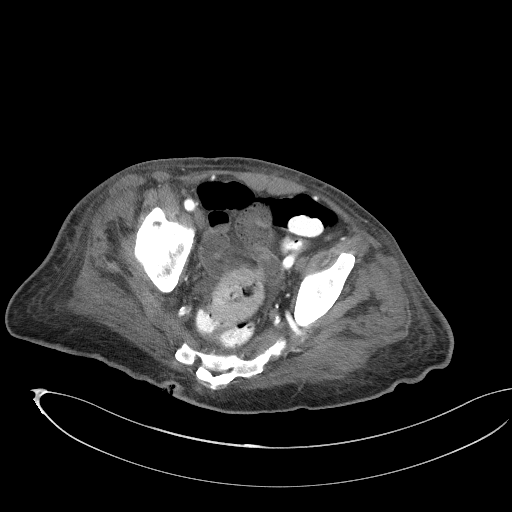
[im 45/115  soft-tissue]
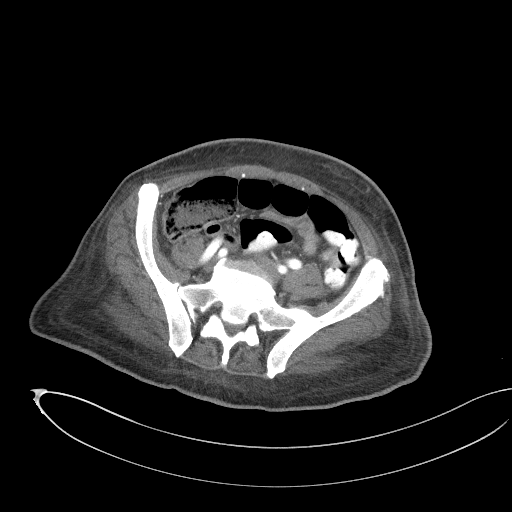
[im 51/115  soft-tissue]
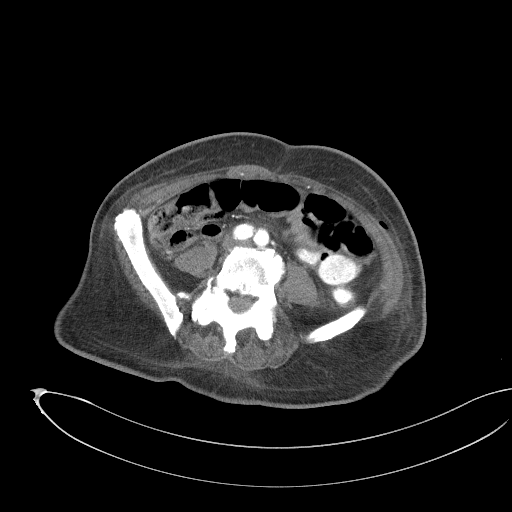
[im 64/115  soft-tissue]
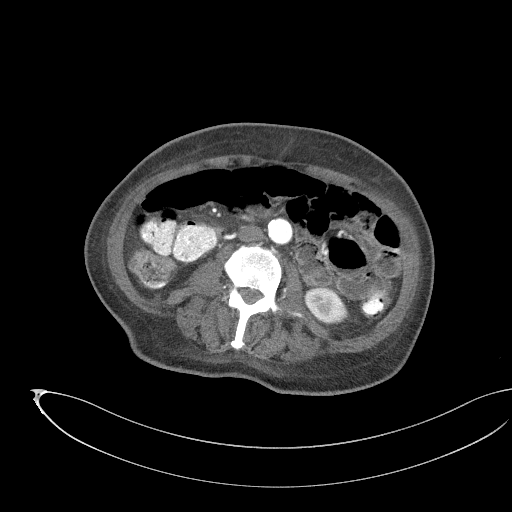
[im 70/115  soft-tissue]
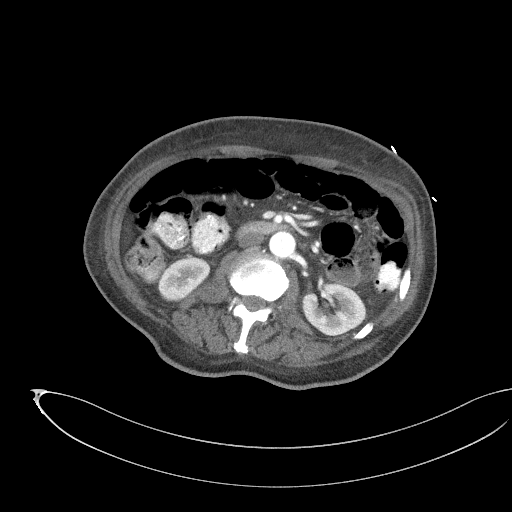
[im 83/115  soft-tissue]
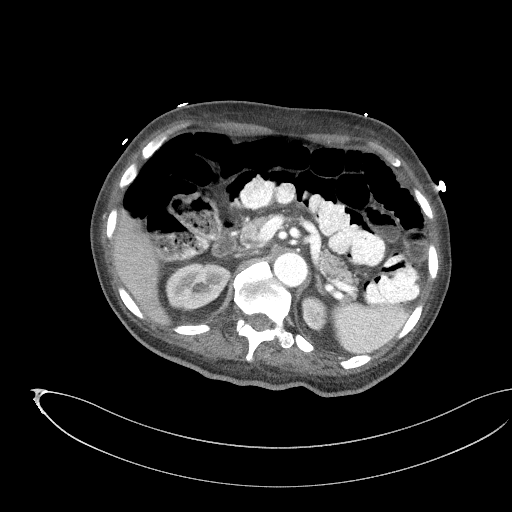
[im 83/115  bone]
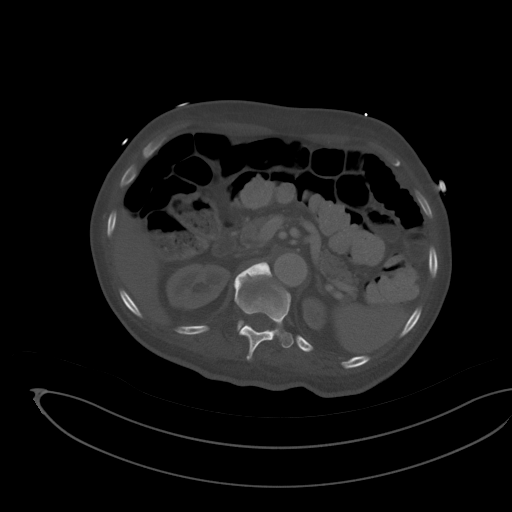
[im 89/115  soft-tissue]
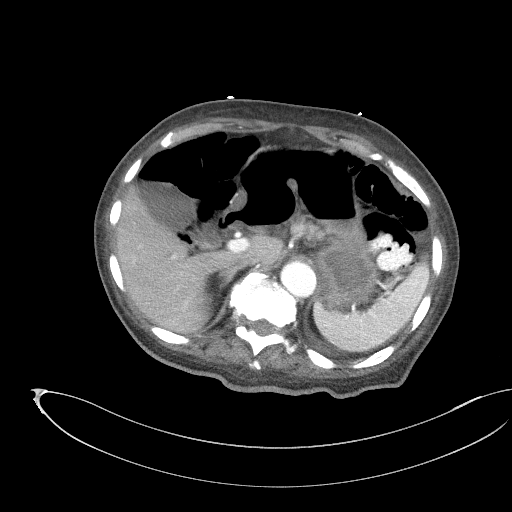
[im 96/115  soft-tissue]
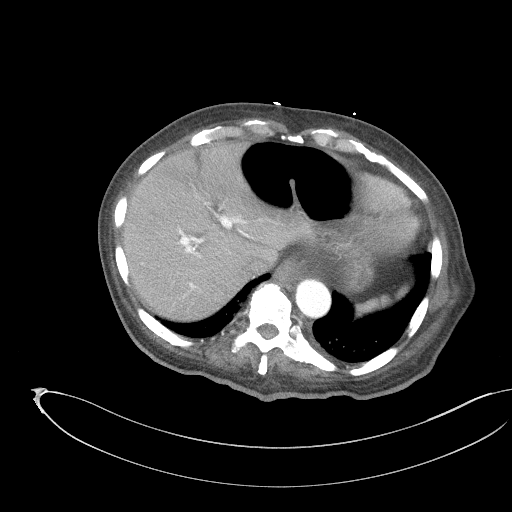
[im 108/115  soft-tissue]
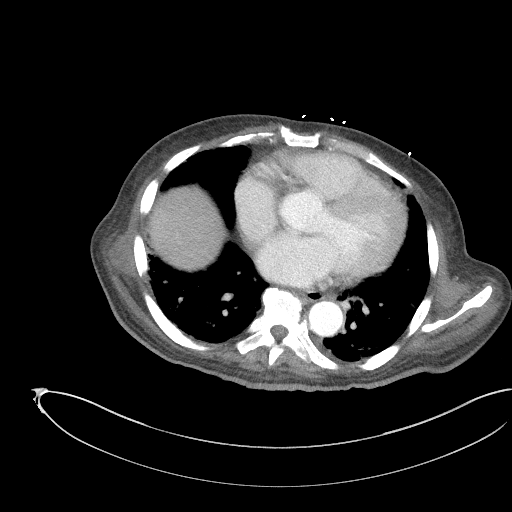

[Series 6: a/p w/ cor · coronal · 0.89mm/px · 3 of 152 slices shown]
[im 51/152  soft-tissue]
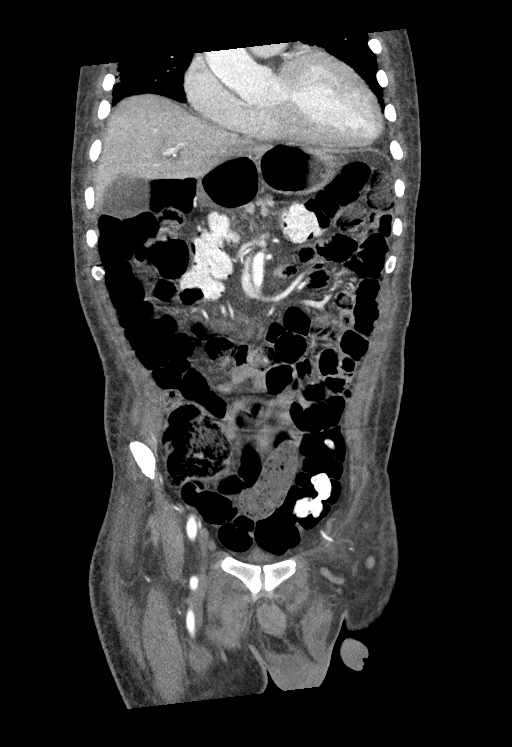
[im 68/152  soft-tissue]
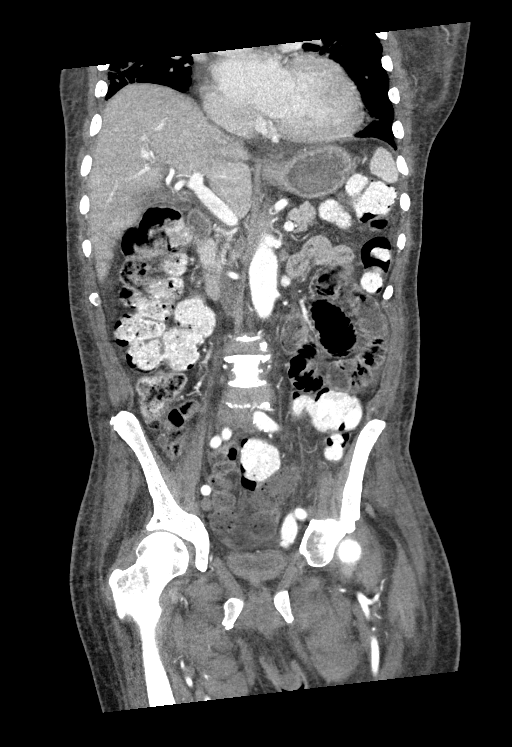
[im 84/152  soft-tissue]
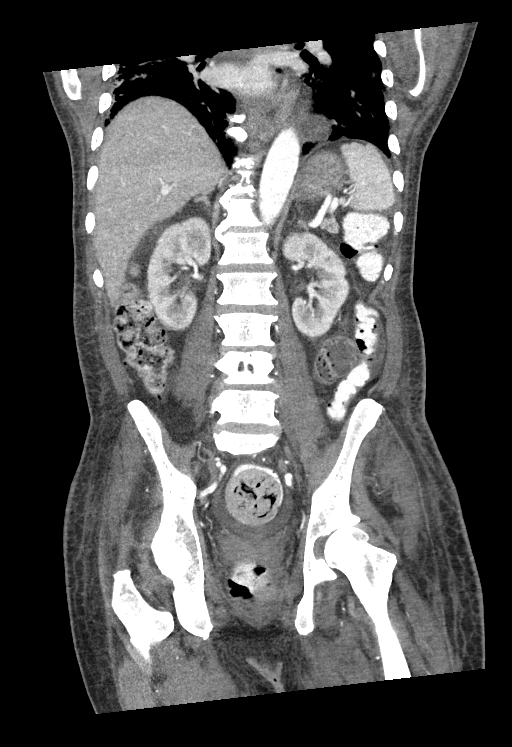

[15 of 46 positions shown; findings below may reference images not displayed]

FINDINGS: Lower chest: Bronchiectasis with interstitial and mild patchy
peripheral airspace opacities are noted at the lung bases. Cyst in
the right middle lobe. Heart mildly enlarged. Small posterior
pericardial effusion. No pleural effusion.

Hepatobiliary: No focal liver abnormality is seen. No gallstones,
gallbladder wall thickening, or biliary dilatation.

Pancreas: Small elongated or tubular type cystic lesion arises from
the pancreatic body, which may reflect a dilated side duct, 1.2 cm
in long axis 4 mm transversely. No other pancreatic mass or lesion.
No inflammation.

Spleen: Normal in size without focal abnormality.

Adrenals/Urinary Tract: No adrenal masses. Kidneys normal in size,
orientation and position with symmetric enhancement and excretion.
1.3 cm cyst, upper pole of the left kidney. Subcentimeter
low-density right renal lesions are also noted, too small to fully
characterize but consistent with cysts. No stones. No
hydronephrosis. Normal ureters. Bladder decompressed but otherwise
unremarkable.

Stomach/Bowel: Stomach are unremarkable. Small bowel and colon are
normal in caliber. No wall thickening. No inflammation. Normal
appendix visualized. Mild increased colonic stool and moderate
increased rectal stool.

Vascular/Lymphatic: Mild aortic atherosclerosis. No significant
stenosis. No enlarged lymph nodes.

Reproductive: Unremarkable.

Other: Trace amount of ascites most evident along the anterior
superior aspect of the liver.

Musculoskeletal: No fracture or acute finding. No osteoblastic or
osteolytic lesions.
IMPRESSION: 1. There are lung base findings including bronchiectasis and
interstitial thickening with peripheral patchy consolidation. This
may all be chronic. Superimposed infection is not excluded.
2. Trace amount of ascites of unclear etiology. No acute findings
within the abdomen or pelvis.
3. Mild increased colonic stool with moderate increased stool in the
rectum.
4. Small pancreatic cystic lesion, 1.2 cm in long axis. Follow-up CT
in 2 years to assess for stability. This is based on the consensus
guidelines for Management of incidental pancreatic cysts but on CT.
5. Aortic atherosclerosis.

## 2021-01-25 IMAGING — DX DG CHEST 1V PORT
1 series · 1 of 1 positions shown · non-contrast
Comparison: 12/13/2019

CLINICAL DATA: Shortness of breath.

EXAM:
PORTABLE CHEST 1 VIEW

[chest ap]
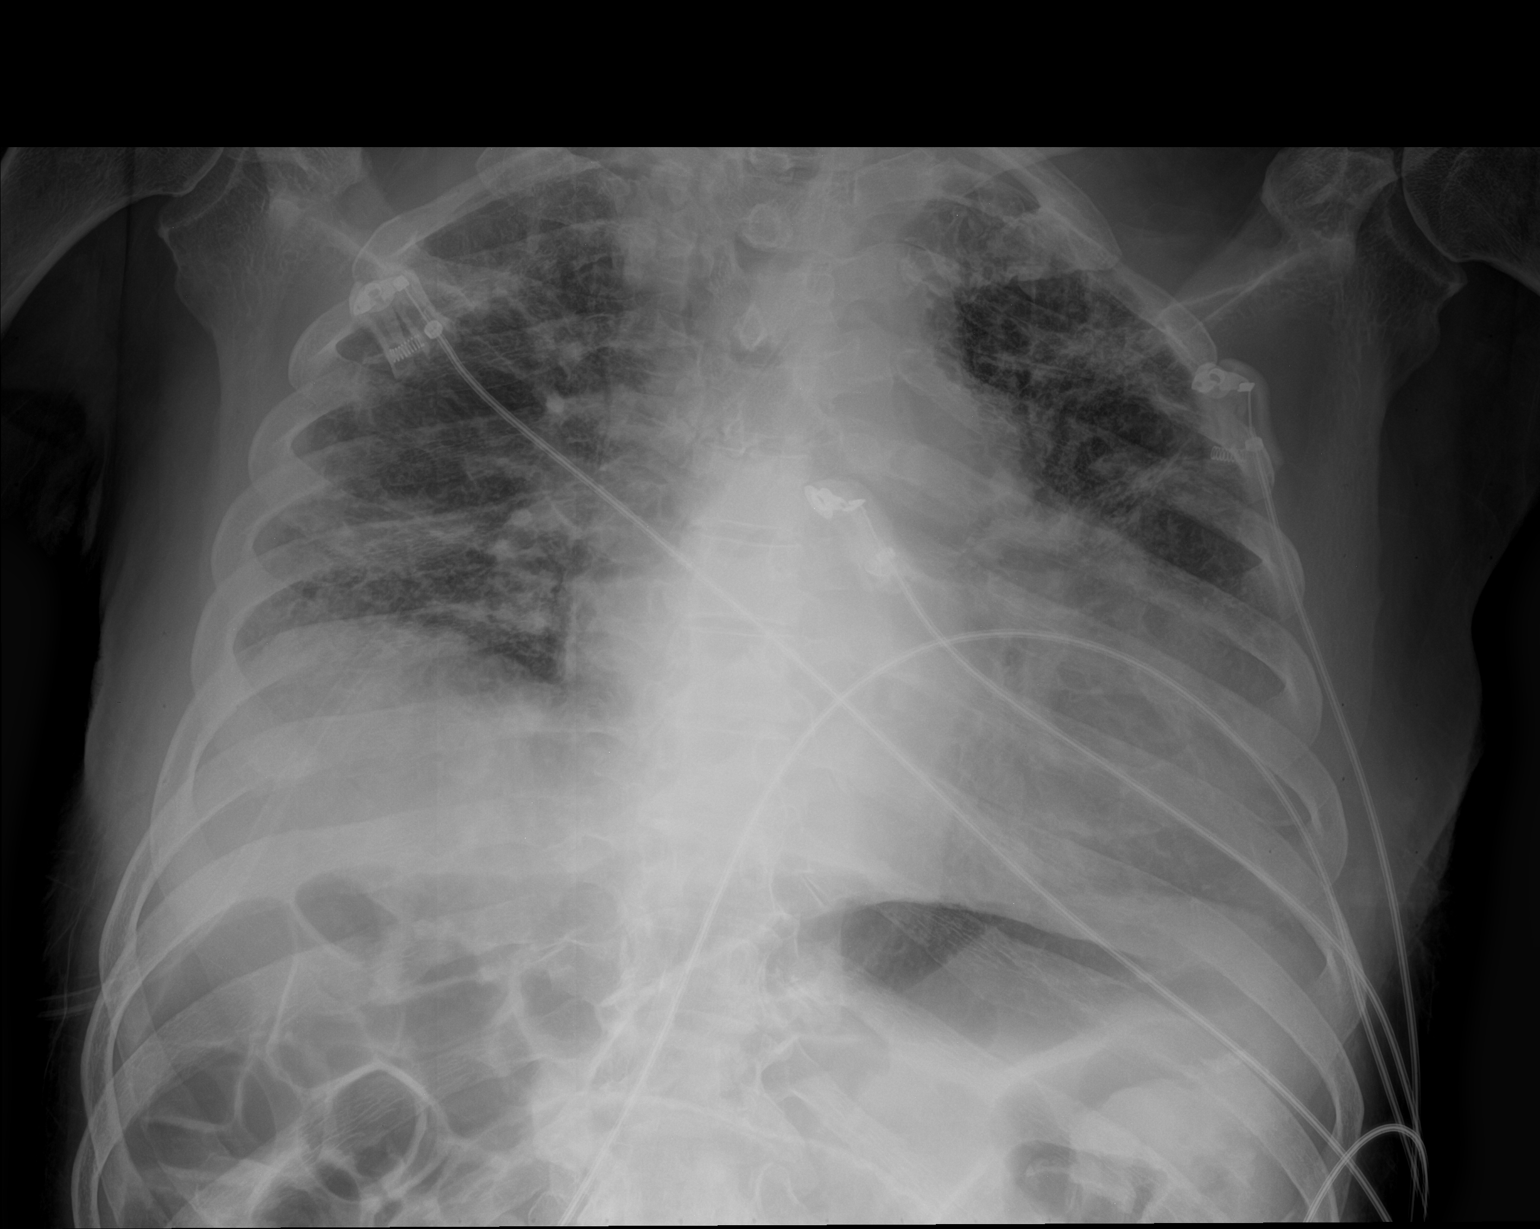

[1 of 1 positions shown; findings below may reference images not displayed]

FINDINGS: The tracheostomy tube is been removed.

Stable cardiac enlargement and prominent mediastinal and hilar
contours.

Stable chronic appearing lung changes and superimposed infiltrates.
No pleural effusions or pneumothorax.
IMPRESSION: 1. Removal of tracheostomy tube.
2. Stable chronic lung changes and superimposed infiltrates.
